# Patient Record
Sex: Male | Born: 1965 | Race: Black or African American | Hispanic: No | Marital: Married | Smoking: Never smoker
Health system: Southern US, Community
[De-identification: ages and names within clinical notes are randomized; demographics above are authoritative.]

## PROBLEM LIST (undated history)

## (undated) ENCOUNTER — Ambulatory Visit (HOSPITAL_COMMUNITY): Admission: EM | Disposition: A | Discharge status: Home / Self Care | Payer: BC Managed Care – PPO

## (undated) DIAGNOSIS — T7840XA Allergy, unspecified, initial encounter: Secondary | ICD-10-CM

## (undated) DIAGNOSIS — R51 Headache: Secondary | ICD-10-CM

## (undated) DIAGNOSIS — I10 Essential (primary) hypertension: Secondary | ICD-10-CM

## (undated) DIAGNOSIS — R519 Headache, unspecified: Secondary | ICD-10-CM

## (undated) DIAGNOSIS — R079 Chest pain, unspecified: Secondary | ICD-10-CM

## (undated) DIAGNOSIS — E785 Hyperlipidemia, unspecified: Secondary | ICD-10-CM

## (undated) HISTORY — PX: KNEE ARTHROSCOPY: SHX127

## (undated) HISTORY — DX: Allergy, unspecified, initial encounter: T78.40XA

## (undated) HISTORY — DX: Chest pain, unspecified: R07.9

## (undated) HISTORY — DX: Headache: R51

## (undated) HISTORY — DX: Essential (primary) hypertension: I10

## (undated) HISTORY — DX: Hyperlipidemia, unspecified: E78.5

## (undated) HISTORY — DX: Headache, unspecified: R51.9

---

## 2000-03-15 ENCOUNTER — Emergency Department (HOSPITAL_COMMUNITY): Admission: EM | Admit: 2000-03-15 | Discharge: 2000-03-22 | Payer: Self-pay | Admitting: Internal Medicine

## 2000-03-22 ENCOUNTER — Emergency Department (HOSPITAL_COMMUNITY): Admission: EM | Admit: 2000-03-22 | Discharge: 2000-03-22 | Payer: Self-pay | Admitting: Emergency Medicine

## 2000-03-22 ENCOUNTER — Encounter: Payer: Self-pay | Admitting: Emergency Medicine

## 2000-08-13 ENCOUNTER — Emergency Department (HOSPITAL_COMMUNITY): Admission: EM | Admit: 2000-08-13 | Discharge: 2000-08-13 | Payer: Self-pay | Admitting: Emergency Medicine

## 2003-08-01 ENCOUNTER — Ambulatory Visit: Admission: RE | Admit: 2003-08-01 | Discharge: 2003-08-01 | Payer: Self-pay | Admitting: Family Medicine

## 2005-03-04 ENCOUNTER — Encounter: Admission: RE | Admit: 2005-03-04 | Discharge: 2005-03-04 | Payer: Self-pay | Admitting: Surgery

## 2005-11-24 ENCOUNTER — Ambulatory Visit: Payer: Self-pay | Admitting: Family Medicine

## 2005-11-24 LAB — CONVERTED CEMR LAB
ALT: 17 units/L (ref 0–40)
AST: 18 units/L (ref 0–37)
Albumin: 4.2 g/dL (ref 3.5–5.2)
Alkaline Phosphatase: 46 units/L (ref 39–117)
BUN: 22 mg/dL (ref 6–23)
CO2: 28 meq/L (ref 19–32)
Calcium: 9.3 mg/dL (ref 8.4–10.5)
Chloride: 107 meq/L (ref 96–112)
Chol/HDL Ratio, serum: 6.2
Cholesterol: 184 mg/dL (ref 0–200)
Creatinine, Ser: 1.1 mg/dL (ref 0.4–1.5)
GFR calc non Af Amer: 80 mL/min
Glomerular Filtration Rate, Af Am: 97 mL/min/{1.73_m2}
Glucose, Bld: 106 mg/dL — ABNORMAL HIGH (ref 70–99)
HCT: 46.1 % (ref 39.0–52.0)
HDL: 29.5 mg/dL — ABNORMAL LOW (ref >39.0)
Hemoglobin: 15.4 g/dL (ref 13.0–17.0)
LDL DIRECT: 82.2 mg/dL
MCHC: 33.5 g/dL (ref 30.0–36.0)
MCV: 87.3 fL (ref 78.0–100.0)
Platelets: 291 10*3/uL (ref 150–400)
Potassium: 3.8 meq/L (ref 3.5–5.1)
RBC: 5.27 M/uL (ref 4.22–5.81)
RDW: 12.7 % (ref 11.5–14.6)
Sodium: 141 meq/L (ref 135–145)
TSH: 3.55 microintl units/mL (ref 0.35–5.50)
Total Bilirubin: 0.8 mg/dL (ref 0.3–1.2)
Total Protein: 7.5 g/dL (ref 6.0–8.3)
Triglyceride fasting, serum: 227 mg/dL (ref 0–149)
VLDL: 45 mg/dL — ABNORMAL HIGH (ref 0–40)
WBC: 5.2 10*3/uL (ref 4.5–10.5)

## 2005-12-28 ENCOUNTER — Ambulatory Visit: Payer: Self-pay | Admitting: Family Medicine

## 2006-01-25 ENCOUNTER — Ambulatory Visit: Payer: Self-pay | Admitting: Family Medicine

## 2006-01-25 LAB — CONVERTED CEMR LAB
BUN: 17 mg/dL (ref 6–23)
CO2: 29 meq/L (ref 19–32)
Calcium: 8.9 mg/dL (ref 8.4–10.5)
Chloride: 101 meq/L (ref 96–112)
Creatinine, Ser: 1.3 mg/dL (ref 0.4–1.5)
GFR calc non Af Amer: 66 mL/min
Glomerular Filtration Rate, Af Am: 79 mL/min/{1.73_m2}
Glucose, Bld: 97 mg/dL (ref 70–99)
Potassium: 3.3 meq/L — ABNORMAL LOW (ref 3.5–5.1)
Sodium: 137 meq/L (ref 135–145)

## 2006-04-29 ENCOUNTER — Ambulatory Visit: Payer: Self-pay | Admitting: Family Medicine

## 2006-04-29 LAB — CONVERTED CEMR LAB
BUN: 22 mg/dL (ref 6–23)
Basophils Absolute: 0 10*3/uL (ref 0.0–0.1)
Basophils Relative: 0.4 % (ref 0.0–1.0)
CO2: 32 meq/L (ref 19–32)
Calcium: 9.4 mg/dL (ref 8.4–10.5)
Chloride: 106 meq/L (ref 96–112)
Creatinine, Ser: 1.3 mg/dL (ref 0.4–1.5)
Eosinophils Absolute: 0.1 10*3/uL (ref 0.0–0.6)
Eosinophils Relative: 1.6 % (ref 0.0–5.0)
GFR calc Af Amer: 79 mL/min
GFR calc non Af Amer: 66 mL/min
Glucose, Bld: 84 mg/dL (ref 70–99)
HCT: 45.6 % (ref 39.0–52.0)
Hemoglobin: 15.4 g/dL (ref 13.0–17.0)
Lymphocytes Relative: 29.2 % (ref 12.0–46.0)
MCHC: 33.9 g/dL (ref 30.0–36.0)
MCV: 85.7 fL (ref 78.0–100.0)
Magnesium: 2.3 mg/dL (ref 1.5–2.5)
Monocytes Absolute: 0.4 10*3/uL (ref 0.2–0.7)
Monocytes Relative: 6 % (ref 3.0–11.0)
Neutro Abs: 4 10*3/uL (ref 1.4–7.7)
Neutrophils Relative %: 62.8 % (ref 43.0–77.0)
Platelets: 334 10*3/uL (ref 150–400)
Potassium: 3.4 meq/L — ABNORMAL LOW (ref 3.5–5.1)
RBC: 5.32 M/uL (ref 4.22–5.81)
RDW: 12.3 % (ref 11.5–14.6)
Sodium: 143 meq/L (ref 135–145)
TSH: 2.8 microintl units/mL (ref 0.35–5.50)
WBC: 6.4 10*3/uL (ref 4.5–10.5)

## 2006-05-12 ENCOUNTER — Ambulatory Visit: Payer: Self-pay | Admitting: Family Medicine

## 2006-05-12 LAB — CONVERTED CEMR LAB: Potassium: 3.6 meq/L (ref 3.5–5.1)

## 2006-05-13 ENCOUNTER — Ambulatory Visit: Payer: Self-pay | Admitting: Family Medicine

## 2006-05-14 ENCOUNTER — Ambulatory Visit: Payer: Self-pay | Admitting: Cardiology

## 2006-05-28 ENCOUNTER — Ambulatory Visit: Payer: Self-pay | Admitting: Family Medicine

## 2006-06-01 ENCOUNTER — Ambulatory Visit: Payer: Self-pay | Admitting: Cardiology

## 2006-06-01 ENCOUNTER — Encounter: Payer: Self-pay | Admitting: Cardiology

## 2006-06-01 ENCOUNTER — Ambulatory Visit: Payer: Self-pay

## 2006-06-16 ENCOUNTER — Ambulatory Visit: Payer: Self-pay | Admitting: Cardiology

## 2006-06-16 ENCOUNTER — Encounter (INDEPENDENT_AMBULATORY_CARE_PROVIDER_SITE_OTHER): Payer: Self-pay | Admitting: Family Medicine

## 2006-06-28 ENCOUNTER — Ambulatory Visit: Payer: Self-pay | Admitting: Cardiology

## 2006-08-30 ENCOUNTER — Ambulatory Visit: Payer: Self-pay | Admitting: Family Medicine

## 2006-08-30 DIAGNOSIS — I10 Essential (primary) hypertension: Secondary | ICD-10-CM | POA: Insufficient documentation

## 2006-08-31 LAB — CONVERTED CEMR LAB
BUN: 18 mg/dL (ref 6–23)
CO2: 30 meq/L (ref 19–32)
Calcium: 8.3 mg/dL — ABNORMAL LOW (ref 8.4–10.5)
Chloride: 104 meq/L (ref 96–112)
Cholesterol: 168 mg/dL (ref 0–200)
Creatinine, Ser: 1.2 mg/dL (ref 0.4–1.5)
GFR calc Af Amer: 87 mL/min
GFR calc non Af Amer: 72 mL/min
Glucose, Bld: 99 mg/dL (ref 70–99)
HDL: 29.8 mg/dL — ABNORMAL LOW (ref >39.0)
LDL Cholesterol: 107 mg/dL — ABNORMAL HIGH (ref 0–99)
Potassium: 3.8 meq/L (ref 3.5–5.1)
Sodium: 140 meq/L (ref 135–145)
Total CHOL/HDL Ratio: 5.6
Triglycerides: 157 mg/dL — ABNORMAL HIGH (ref 0–149)
VLDL: 31 mg/dL (ref 0–40)

## 2006-09-01 ENCOUNTER — Encounter (INDEPENDENT_AMBULATORY_CARE_PROVIDER_SITE_OTHER): Payer: Self-pay | Admitting: *Deleted

## 2006-09-01 LAB — CONVERTED CEMR LAB: Anti Nuclear Antibody(ANA): NEGATIVE

## 2006-10-06 ENCOUNTER — Encounter: Payer: Self-pay | Admitting: Family Medicine

## 2006-10-15 ENCOUNTER — Ambulatory Visit: Payer: Self-pay | Admitting: Family Medicine

## 2006-10-15 DIAGNOSIS — G43009 Migraine without aura, not intractable, without status migrainosus: Secondary | ICD-10-CM | POA: Insufficient documentation

## 2006-11-09 ENCOUNTER — Ambulatory Visit: Payer: Self-pay | Admitting: Family Medicine

## 2006-11-10 ENCOUNTER — Telehealth (INDEPENDENT_AMBULATORY_CARE_PROVIDER_SITE_OTHER): Payer: Self-pay | Admitting: *Deleted

## 2006-11-29 ENCOUNTER — Ambulatory Visit: Payer: Self-pay | Admitting: Family Medicine

## 2007-09-01 ENCOUNTER — Telehealth (INDEPENDENT_AMBULATORY_CARE_PROVIDER_SITE_OTHER): Payer: Self-pay | Admitting: *Deleted

## 2007-09-22 ENCOUNTER — Encounter: Payer: Self-pay | Admitting: Internal Medicine

## 2007-10-04 ENCOUNTER — Telehealth (INDEPENDENT_AMBULATORY_CARE_PROVIDER_SITE_OTHER): Payer: Self-pay | Admitting: *Deleted

## 2007-10-25 ENCOUNTER — Ambulatory Visit: Payer: Self-pay | Admitting: Internal Medicine

## 2007-10-25 DIAGNOSIS — E785 Hyperlipidemia, unspecified: Secondary | ICD-10-CM | POA: Insufficient documentation

## 2007-10-31 ENCOUNTER — Encounter (INDEPENDENT_AMBULATORY_CARE_PROVIDER_SITE_OTHER): Payer: Self-pay | Admitting: *Deleted

## 2007-10-31 LAB — CONVERTED CEMR LAB
BUN: 16 mg/dL (ref 6–23)
CO2: 33 meq/L — ABNORMAL HIGH (ref 19–32)
Calcium: 9.4 mg/dL (ref 8.4–10.5)
Chloride: 101 meq/L (ref 96–112)
Creatinine, Ser: 1.1 mg/dL (ref 0.4–1.5)
GFR calc Af Amer: 96 mL/min
GFR calc non Af Amer: 79 mL/min
Glucose, Bld: 87 mg/dL (ref 70–99)
Potassium: 3.7 meq/L (ref 3.5–5.1)
Sodium: 142 meq/L (ref 135–145)

## 2007-11-14 ENCOUNTER — Telehealth (INDEPENDENT_AMBULATORY_CARE_PROVIDER_SITE_OTHER): Payer: Self-pay | Admitting: *Deleted

## 2007-12-26 ENCOUNTER — Telehealth (INDEPENDENT_AMBULATORY_CARE_PROVIDER_SITE_OTHER): Payer: Self-pay | Admitting: *Deleted

## 2008-06-19 ENCOUNTER — Encounter (INDEPENDENT_AMBULATORY_CARE_PROVIDER_SITE_OTHER): Payer: Self-pay | Admitting: *Deleted

## 2008-06-21 ENCOUNTER — Telehealth (INDEPENDENT_AMBULATORY_CARE_PROVIDER_SITE_OTHER): Payer: Self-pay | Admitting: *Deleted

## 2008-07-04 ENCOUNTER — Ambulatory Visit: Payer: Self-pay | Admitting: Internal Medicine

## 2008-07-04 ENCOUNTER — Encounter: Payer: Self-pay | Admitting: Emergency Medicine

## 2008-07-05 ENCOUNTER — Ambulatory Visit: Payer: Self-pay | Admitting: Cardiology

## 2008-07-05 ENCOUNTER — Inpatient Hospital Stay (HOSPITAL_COMMUNITY): Admission: EM | Admit: 2008-07-05 | Discharge: 2008-07-05 | Payer: Self-pay | Admitting: Internal Medicine

## 2008-07-12 ENCOUNTER — Telehealth (INDEPENDENT_AMBULATORY_CARE_PROVIDER_SITE_OTHER): Payer: Self-pay | Admitting: *Deleted

## 2008-07-16 ENCOUNTER — Ambulatory Visit: Payer: Self-pay | Admitting: Internal Medicine

## 2008-07-16 ENCOUNTER — Encounter: Payer: Self-pay | Admitting: Cardiovascular Disease

## 2008-07-16 ENCOUNTER — Ambulatory Visit: Payer: Self-pay

## 2008-07-17 ENCOUNTER — Ambulatory Visit: Payer: Self-pay | Admitting: Internal Medicine

## 2008-07-17 LAB — CONVERTED CEMR LAB
ALT: 30 units/L (ref 0–53)
AST: 33 units/L (ref 0–37)
Albumin: 4 g/dL (ref 3.5–5.2)
Alkaline Phosphatase: 47 units/L (ref 39–117)
BUN: 17 mg/dL (ref 6–23)
Bilirubin, Direct: 0.2 mg/dL (ref 0.0–0.3)
CO2: 29 meq/L (ref 19–32)
Calcium: 8.7 mg/dL (ref 8.4–10.5)
Chloride: 103 meq/L (ref 96–112)
Cholesterol: 152 mg/dL (ref 0–200)
Creatinine, Ser: 1.1 mg/dL (ref 0.4–1.5)
Direct LDL: 46.9 mg/dL
GFR calc non Af Amer: 95.08 mL/min (ref >60)
Glucose, Bld: 86 mg/dL (ref 70–99)
HDL: 30.9 mg/dL — ABNORMAL LOW (ref >39.00)
Potassium: 3 meq/L — ABNORMAL LOW (ref 3.5–5.1)
Sodium: 142 meq/L (ref 135–145)
Total Bilirubin: 0.9 mg/dL (ref 0.3–1.2)
Total CHOL/HDL Ratio: 5
Total Protein: 7.2 g/dL (ref 6.0–8.3)
Triglycerides: 404 mg/dL — ABNORMAL HIGH (ref 0.0–149.0)
VLDL: 80.8 mg/dL — ABNORMAL HIGH (ref 0.0–40.0)

## 2008-07-20 ENCOUNTER — Ambulatory Visit: Payer: Self-pay | Admitting: Internal Medicine

## 2008-11-23 ENCOUNTER — Ambulatory Visit: Payer: Self-pay | Admitting: Internal Medicine

## 2008-12-03 ENCOUNTER — Encounter (INDEPENDENT_AMBULATORY_CARE_PROVIDER_SITE_OTHER): Payer: Self-pay | Admitting: *Deleted

## 2008-12-03 LAB — CONVERTED CEMR LAB
BUN: 19 mg/dL (ref 6–23)
Basophils Absolute: 0 10*3/uL (ref 0.0–0.1)
Basophils Relative: 0.3 % (ref 0.0–3.0)
CO2: 28 meq/L (ref 19–32)
Calcium: 9.2 mg/dL (ref 8.4–10.5)
Chloride: 103 meq/L (ref 96–112)
Cholesterol: 168 mg/dL (ref 0–200)
Creatinine, Ser: 1.1 mg/dL (ref 0.4–1.5)
Direct LDL: 68.6 mg/dL
Eosinophils Absolute: 0 10*3/uL (ref 0.0–0.7)
Eosinophils Relative: 0.7 % (ref 0.0–5.0)
GFR calc non Af Amer: 94.92 mL/min (ref >60)
Glucose, Bld: 99 mg/dL (ref 70–99)
HCT: 47.1 % (ref 39.0–52.0)
HDL: 38.4 mg/dL — ABNORMAL LOW (ref >39.00)
Hemoglobin: 15.9 g/dL (ref 13.0–17.0)
Lymphocytes Relative: 31.8 % (ref 12.0–46.0)
Lymphs Abs: 2 10*3/uL (ref 0.7–4.0)
MCHC: 33.8 g/dL (ref 30.0–36.0)
MCV: 88.8 fL (ref 78.0–100.0)
Monocytes Absolute: 0.4 10*3/uL (ref 0.1–1.0)
Monocytes Relative: 6.8 % (ref 3.0–12.0)
Neutro Abs: 3.9 10*3/uL (ref 1.4–7.7)
Neutrophils Relative %: 60.4 % (ref 43.0–77.0)
Platelets: 300 10*3/uL (ref 150.0–400.0)
Potassium: 3.8 meq/L (ref 3.5–5.1)
RBC: 5.3 M/uL (ref 4.22–5.81)
RDW: 12.6 % (ref 11.5–14.6)
Sodium: 141 meq/L (ref 135–145)
TSH: 2.7 microintl units/mL (ref 0.35–5.50)
Total CHOL/HDL Ratio: 4
Triglycerides: 233 mg/dL — ABNORMAL HIGH (ref 0.0–149.0)
VLDL: 46.6 mg/dL — ABNORMAL HIGH (ref 0.0–40.0)
WBC: 6.3 10*3/uL (ref 4.5–10.5)

## 2008-12-04 ENCOUNTER — Encounter: Payer: Self-pay | Admitting: Internal Medicine

## 2009-01-08 ENCOUNTER — Ambulatory Visit: Payer: Self-pay | Admitting: Internal Medicine

## 2009-01-31 ENCOUNTER — Encounter: Admission: RE | Admit: 2009-01-31 | Discharge: 2009-05-01 | Payer: Self-pay | Admitting: Orthopedic Surgery

## 2009-05-01 ENCOUNTER — Ambulatory Visit: Payer: Self-pay | Admitting: Internal Medicine

## 2009-11-12 ENCOUNTER — Ambulatory Visit: Payer: Self-pay | Admitting: Internal Medicine

## 2009-11-18 LAB — CONVERTED CEMR LAB
BUN: 17 mg/dL (ref 6–23)
CO2: 29 meq/L (ref 19–32)
Calcium: 9.6 mg/dL (ref 8.4–10.5)
Chloride: 104 meq/L (ref 96–112)
Creatinine, Ser: 1.1 mg/dL (ref 0.4–1.5)
GFR calc non Af Amer: 97.53 mL/min (ref >60)
Glucose, Bld: 76 mg/dL (ref 70–99)
Potassium: 4.1 meq/L (ref 3.5–5.1)
Sodium: 139 meq/L (ref 135–145)

## 2010-02-20 NOTE — Letter (Signed)
Summary: [facility]  [facility]   Imported By: Lanelle Bal 12/11/2008 13:44:42  _____________________________________________________________________  External Attachment:    Type:   Image     Comment:   External Document

## 2010-02-20 NOTE — Assessment & Plan Note (Signed)
Summary: follow up   ph   Vital Signs:  Patient Profile:   45 Years Old Male Weight:      208.50 pounds Temp:     98.3 degrees F oral Pulse rate:   68 / minute Resp:     16 per minute BP sitting:   120 / 90  (right arm)  Pt. in pain?   no  Vitals Entered By: Ardyth Man (November 29, 2006 1:09 PM)                  Chief Complaint:  ANKLE FOLLOW UP.  History of Present Illness: patient reports that his Achilles' tendon discomfort is resolved, but now having some discomfort at the bottom of the left heel. Typically the discomfort present after waking up first in the morning or after prolonged sitting.  He states that the pain is not severe and it does not hinder him from doing his daily activity.  Current Allergies: No known allergies       Physical Exam  Extremities:     semination the left foot is significant for tenderness over the calcaneal tuberosity.  No obvious deformity noted.    Impression & Recommendations:  Problem # 1:  PLANTAR FASCIITIS (ICD-728.71)   Discussed use of gel inserts, ice massage, and stretching exercises.  patient information was provided.  Advise patient if no improvement over 8-12  weeks he should follow up or if worsens. His updated medication list for this problem includes:    Ketoprofen 75 Mg Caps (Ketoprofen) .Marland Kitchen... Take as directed   Complete Medication List: 1)  Hydrochlorothiazide 25 Mg Tabs (Hydrochlorothiazide) .Marland Kitchen.. 1 by mouth qd 2)  Klor-con M20 20 Meq Tbcr (Potassium chloride crys cr) .Marland Kitchen.. 1 by mouth qd 3)  Aspirin 81 Mg Tbec (Aspirin) 4)  Fish Oil  5)  Glucosamine  6)  Multi-vitamin  7)  Relpax 40 Mg Tabs (Eletriptan hydrobromide) .... Take one tablet at onset of headache and repeat in two hours if needed. 8)  Ketoprofen 75 Mg Caps (Ketoprofen) .... Take as directed     ]

## 2010-02-20 NOTE — Letter (Signed)
Summary: Primary Care Appointment Letter  [facility] [place]  [address] [place], [place] [address]   Phone: [phone]  Fax: [phone]    06/19/2008 MRN: [id number]  [facility] [address] [place], [place]  [address]  Dear Mr. Jimmey Ralph,   Your Primary Care Physician [place] E. Paz MD has indicated that:    ___X____it is time to schedule an appointment for Physical and Fasting labs before next refill.     _______you missed your appointment on______ and need to call and          reschedule.    _______you need to have lab work done.    _______you need to schedule an appointment discuss lab or test results.    _______you need to call to reschedule your appointment that is                       scheduled on _________.     Please call our office as soon as possible. Our phone number is [phone].     Thank you,    [facility] Sched[facility]

## 2010-02-20 NOTE — Assessment & Plan Note (Signed)
Summary: FOLLOWUP FOR HEART MEDICATION///SPH   Vital Signs:  Patient profile:   45 year old male Weight:      212 pounds Pulse rate:   75 / minute Pulse rhythm:   regular BP sitting:   144 / 86  (left arm) Cuff size:   large  Vitals Entered By: Army Fossa CMA (November 12, 2009 2:08 PM) CC: Pt here to f/u on BP meds- not fasitng  Comments [facility] [address]    History of Present Illness: ROV  Feeling well Occasionally feels some pressure in the right hip with certain movements, no injury. No inguinal hernias that he can tell  Current Medications (verified): 1)  Maxzide-25 37.5-25 Mg Tabs (Triamterene-Hctz) .... One P.o. Daily. Must Make Appt For Additional Refills. 2)  Relpax 40 Mg  Tabs (Eletriptan Hydrobromide) .... Take One Tablet At Onset of Headache and Repeat in Two Hours If Needed. 3)  Ketoprofen 75 Mg  Caps (Ketoprofen) .... Take As Directed 4)  Aspirin 81 Mg  Tbec (Aspirin) .... Take One Tablet By Mouth Once Daily. 5)  Fish Oil   Oil (Fish Oil) .... 5 Tablets Daily 6)  Multivitamins   Tabs (Multiple Vitamin) .... Take One Tablet By Mouth Once Daily.  Allergies (verified): No Known Drug Allergies  Past History:  Past Medical History: Reviewed history from 07/17/2008 and no changes required. Hypertension COMMON MIGRAINE  (triggers intense light, pork) Hyperlipidemia Chest pain   --exercise ech 2008 normal   --exercise Myoview June 2010: normal  Past Surgical History: Reviewed history from 10/25/2007 and no changes required. left knee scope x 2  Family History: Reviewed history from 10/25/2007 and no changes required. CAD - F, PGF DM - PGM stroke - PGF HTN - F colon Ca - no prostate Ca - no M deceased - lupus  Social History: Married no children Occupation: work in an office Never Smoked Alcohol use-yes (occasionally) Drug use-no Regular exercise- some  diet-- regular  caffeine use - 3-4 sodas once daily  Review of Systems   ambulatory BP is within normal, "usually lower than today" Good medication compliance History of migraines, getting headaches very rarely Denies chest pain or shortness of breath    Physical Exam  General:  alert and well-developed.   Lungs:  normal respiratory effort, no intercostal retractions, no accessory muscle use, and normal breath sounds.   Heart:  normal rate, regular rhythm, and no murmur.   Abdomen:  soft, non-tender, and no inguinal hernia.   Extremities:  normal rotation of the hips bilaterally, no pain   Impression & Recommendations:  Problem # 1:  HYPERLIPIDEMIA (ICD-272.4) on diet only Labs Reviewed: SGOT: 33 (07/16/2008)   SGPT: 30 (07/16/2008)   HDL:38.40 (11/23/2008), 30.90 (07/16/2008)  LDL:107 (08/30/2006), DEL (11/24/2005)  Chol:168 (11/23/2008), 152 (07/16/2008)  Trig:233.0 (11/23/2008), 404.0 (07/16/2008)  Problem # 2:  HYPERTENSION (ICD-401.9) well-controlled His updated medication list for this problem includes:    Maxzide-25 37.5-25 Mg Tabs (Triamterene-hctz) ..... One p.o. daily.  Orders: Venipuncture ([address]) TLB-BMP (Basic Metabolic Panel-BMET) (80048-METABOL) Specimen Handling ([address])  BP today: 144/86 Prior BP: 142/80 (05/01/2009)  Labs Reviewed: K+: 3.8 (11/23/2008) Creat: : 1.1 (11/23/2008)   Chol: 168 (11/23/2008)   HDL: 38.40 (11/23/2008)   LDL: 107 (08/30/2006)   TG: 233.0 (11/23/2008)  Problem # 3:  COMMON MIGRAINE (ICD-346.10) refill medicines His updated medication list for this problem includes:    Relpax 40 Mg Tabs (Eletriptan hydrobromide) .Marland Kitchen... Take one tablet at onset of headache and repeat in  two hours if needed.    Ketoprofen 75 Mg Caps (Ketoprofen) .Marland Kitchen... Take as directed    Aspirin 81 Mg Tbec (Aspirin) .Marland Kitchen... Take one tablet by mouth once daily.  Problem # 4:  right hip pain observation for now, will call if worse  Complete Medication List: 1)  Maxzide-25 37.5-25 Mg Tabs (Triamterene-hctz) .... One p.o. daily. 2)   Relpax 40 Mg Tabs (Eletriptan hydrobromide) .... Take one tablet at onset of headache and repeat in two hours if needed. 3)  Ketoprofen 75 Mg Caps (Ketoprofen) .... Take as directed 4)  Aspirin 81 Mg Tbec (Aspirin) .... Take one tablet by mouth once daily. 5)  Fish Oil Oil (Fish oil) .... 5 tablets daily 6)  Multivitamins Tabs (Multiple vitamin) .... Take one tablet by mouth once daily.  Patient Instructions: 1)  Please schedule a follow-up appointment in 4 to 5  months, fasting, physical exam Prescriptions: KETOPROFEN 75 MG  CAPS (KETOPROFEN) Take as directed  #60 x 0   Entered and Authorized by:   Nolon Rod. Paz MD   Signed by:   Nolon Rod. Paz MD on 11/12/2009   Method used:   Electronically to        [facility] [phone]* (retail)       [address]       [place], [place]  [address]       Ph: [phone]       Fax: [phone]   RxID:   [id number] RELPAX 40 MG  TABS (ELETRIPTAN HYDROBROMIDE) Take one tablet at onset of headache and repeat in two hours if needed.  #8 x 2   Entered and Authorized by:   Nolon Rod. Paz MD   Signed by:   Nolon Rod. Paz MD on 11/12/2009   Method used:   Electronically to        [facility] [phone]* (retail)       [address]       [place], [place]  [address]       Ph: [phone]       Fax: [phone]   RxID:   [id number] MAXZIDE-25 37.5-25 MG TABS (TRIAMTERENE-HCTZ) one p.o. daily.  #90 x 2   Entered and Authorized by:   Nolon Rod. Paz MD   Signed by:   Nolon Rod. Paz MD on 11/12/2009   Method used:   Electronically to        [facility] [phone]* (retail)       [address]       [place], [place]  [address]       Ph: [phone]       Fax: [phone]   RxID:   [id number] MAXZIDE-25 37.5-25 MG TABS (TRIAMTERENE-HCTZ) one p.o. daily. MUST MAKE APPT FOR ADDITIONAL REFILLS.  #30 x 5   Entered by:   Army Fossa CMA   Authorized by:   Nolon Rod. Paz MD   Signed by:   Army Fossa CMA on 11/12/2009   Method used:   Electronically to        [facility] [phone]* (retail)       [address]       [place], [place]  [address]       Ph: [phone]  Fax: [phone]   RxID:   [id number]    Orders Added: 1)  Venipuncture [30865] 2)  TLB-BMP (Basic Metabolic Panel-BMET) [80048-METABOL] 3)  Specimen Handling [99000] 4)  Est. Patient Level III [78469]   Immunization History:  Influenza Immunization History:    Influ[facility]:  [facility]  (11/06/2009)   Immunization History:  Influenza Immunization History:    Influenza:  got @ [facility]  (11/06/2009)

## 2010-02-20 NOTE — Assessment & Plan Note (Signed)
    History of Present Illness: Reports he feels back to baseline. Returned from his vacation last week. No recurrenceof symptoms. Reviewed with him the negative lyme and RMSF titers.  Patietn also states has his father fax mother death certificate to see if there is anything he need to worry about since she had lupus and died suddently.          Impression & Recommendations:  Problem # 1:  VIRAL INFECTION (ICD-079.99) 1.no new treatment 2.Reviewed mother's death certificate and she died of septic shock secondary to a pneumonia.   Patient Instructions: 1)  Please schedule a follow-up appointment in 3 months. for bp check. At that point can check ANA if he would like although patient asymptomatic.

## 2010-02-20 NOTE — Assessment & Plan Note (Signed)
Summary: RIGHT ANKLE PAIN/ALJ   Vital Signs:  Patient Profile:   45 Years Old Male Weight:      203.50 pounds Temp:     98.1 degrees F oral Pulse rate:   62 / minute Resp:     14 per minute BP sitting:   110 / 80  (right arm)  Pt. in pain?   yes    Location:   ankle    Intensity:   6    Type:       sharp  Vitals Entered By: Ardyth Man (November 09, 2006 11:10 AM)                  Chief Complaint:  Right ankle hurting since last Monday-Tuesday.  History of Present Illness: Marcus Santiago reports that 1 week ago started feeling discomfort in left heel/achilles tendon. Started after playing basketball which he plays twice per week. He has iced the area and has not played basketball this past week. Reports it feels better. No gait disturbance. No weakness  Current Allergies: No known allergies       Physical Exam  Msk:     Examination of left heel/ankle is significant for no edema/swelling. Full ROM. Minimal tenderness inferior portion of achilles. Gait normal. Pulses:     dorsalis pedis and posterior tibial pulses are full and equal bilaterally    Impression & Recommendations:  Problem # 1:  ACHILLES TENDINITIS, MILD (ICD-726.71) Patient information provided Recommended avoiding precipitating activity until pain free Exercise instructions provided NSAID instructions, potential side effects reviewed Advise not take more than 10days and monitor blood pressure F/u if no improvement or worsens  Complete Medication List: 1)  Hydrochlorothiazide 25 Mg Tabs (Hydrochlorothiazide) .Marland Kitchen.. 1 by mouth qd 2)  Klor-con M20 20 Meq Tbcr (Potassium chloride crys cr) .Marland Kitchen.. 1 by mouth qd 3)  Aspirin 81 Mg Tbec (Aspirin) 4)  Fish Oil  5)  Glucosamine  6)  Multi-vitamin  7)  Relpax 40 Mg Tabs (Eletriptan hydrobromide) .... Take one tablet at onset of headache and repeat in two hours if needed. 8)  Ketoprofen 75 Mg Caps (Ketoprofen) .... Take as directed     ]

## 2010-02-20 NOTE — Letter (Signed)
Summary: Handout Printed  Printed Handout:  - *[facility] Patient Instructions 

## 2010-02-20 NOTE — Assessment & Plan Note (Signed)
Summary: eph/jml  Medications Added ASPIRIN 81 MG  TBEC (ASPIRIN) Take one tablet by mouth once daily. FISH OIL   OIL (FISH OIL) 5 tablets daily MULTIVITAMINS   TABS (MULTIPLE VITAMIN) Take one tablet by mouth once daily.      Allergies Added: NKDA  Visit Type:  Follow-up Primary Simren Popson:  Nolon Rod. Paz MD   History of Present Illness: 45 y/o male with h/o HTN and CP with normal exercise echo in 2008. recently admitted overnight with atypcial CP. ECG and cardiac enzymes normal. Had f/u muoview which showed good exercise tolerance and no ischemia or infarct. Normal EF.  After being d/c'd from hospital had several days of diarrhea and still says he doesn't feel quite right with early satiety. Occasional abdominal/chest discomfort which gets some better with Mylanta. Exercise tolearnace good though he doesn't workout as much as he wants.   Current Medications (verified): 1)  Hydrochlorothiazide 25 Mg  Tabs (Hydrochlorothiazide) .Marland Kitchen.. 1 By Mouth Qd 2)  Relpax 40 Mg  Tabs (Eletriptan Hydrobromide) .... Take One Tablet At Onset of Headache and Repeat in Two Hours If Needed. 3)  Ketoprofen 75 Mg  Caps (Ketoprofen) .... Take As Directed 4)  Aspirin 81 Mg  Tbec (Aspirin) .... Take One Tablet By Mouth Once Daily. 5)  Fish Oil   Oil (Fish Oil) .... 5 Tablets Daily 6)  Multivitamins   Tabs (Multiple Vitamin) .... Take One Tablet By Mouth Once Daily.  Allergies (verified): No Known Drug Allergies  Past History:  Past Medical History: Hypertension COMMON MIGRAINE  (triggers intense light, pork) Hyperlipidemia Chest pain   --exercise ech 2008 normal   --exercise Myoview June 2010: normal  Review of Systems       As per HPI and past medical history; otherwise all systems negative.   Vital Signs:  Patient profile:   45 year old male Height:      70 inches Weight:      217 pounds BMI:     31.25 Pulse rate:   108 / minute BP sitting:   130 / 72  (left arm)  Vitals Entered By: Laurance Flatten CMA (July 17, 2008 2:50 PM)  Physical Exam  General:  Gen: well appearing. no resp difficulty HEENT: normal Neck: supple. no JVD. Carotids 2+ bilat; not bruits. No lymphadenopathy or thryomegaly appreciated. Cor: PMI nondisplaced. Regular rate & rhythm. No rubs, gallops, murmur. Lungs: clear Abdomen: soft, nontender, nondistended. No hepatosplenomegaly. No bruits or masses. Good bowel sounds. Extremities: no cyanosis, clubbing, rash, edema Neuro: alert & orientedx3, cranial nerves grossly intact. moves all 4 extremities w/o difficulty. affect pleasant    Impression & Recommendations:  Problem # 1:  CHEST DISCOMFORT, ATYPICAL (ICD-786.59) Myoview reassuring. Suspect it may be GI in nature. Recommneded trial of over-the-counter PPI.  Problem # 2:  HYPERTENSION (ICD-401.9) BP fairly well contrlled though he is having hypokalemia on HCTZ. Consider changing to another agent, possibly spironlactone or norvasc.  Problem # 3:  TACHYCARDIA (ICD-785) HRs in the hospital were in 60-70 range. If tachycardia persists would check labs and echo.  Has f/u with Dr. Drue Novel in 1-2 weeks.

## 2010-02-20 NOTE — Letter (Signed)
Summary: Handout Printed  Printed Handout:  - Achilles Tendon Injury

## 2010-02-20 NOTE — Letter (Signed)
Summary: Results Follow up Letter  [facility] [place]  [address] [place], [place] [address]   Phone: [phone]  Fax: [phone]    10/31/2007 MRN: [id number]  [facility] [address] [place], [place]  [address]  Dear Marcus Santiago,  The following are the results of your recent test(s):  Test         Result    Pap Smear:        Normal _____  Not Normal _____ Comments: ______________________________________________________ Cholesterol: LDL(Bad cholesterol):         Your goal is less than:         HDL (Good cholesterol):       Your goal is more than: Comments:  ______________________________________________________ Mammogram:        Normal _____  Not Normal _____ Comments:  ___________________________________________________________________ Hemoccult:        Normal _____  Not normal _______ Comments:    _____________________________________________________________________ Other Tests: PLEASE SEE ATTACHED LABS FROM 10/25/07 AND COMMENTS    We routinely do not discuss normal results over the telephone.  If you desire a copy of the results, or you have any questions about this information we can discuss them at your next office visit.   Sincerely,

## 2010-02-20 NOTE — Letter (Signed)
Summary: Results Follow up Letter  [facility] [place]  [address] [place], [place] [address]   Phone: [phone]  Fax: [phone]    12/03/2008 MRN: [id number]  [facility] [address] [place], [place]  [address]  Dear Mr. Haberle,  The following are the results of your recent test(s):  Test         Result    Pap Smear:        Normal _____  Not Normal _____ Comments: ______________________________________________________ Cholesterol: LDL(Bad cholesterol):         Your goal is less than:         HDL (Good cholesterol):       Your goal is more than: Comments:  ______________________________________________________ Mammogram:        Normal _____  Not Normal _____ Comments:  ___________________________________________________________________ Hemoccult:        Normal _____  Not normal _______ Comments:    _____________________________________________________________________ Other Tests:  SEE ATTACHED LABS:  - potassium is now normal  - continue same medications  - triglycerides are better  - continue with fish oil, diet, & exercise  - GOOD RESULTS!!!  - follow up as planned Call me if you have any questions. stacia [phone] ext [phone]

## 2010-02-20 NOTE — Progress Notes (Signed)
Summary: hctz rx  Phone Note Refill Request Message from:  Patient  Refills Requested: Medication #1:  HYDROCHLOROTHIAZIDE 25 MG  TABS 1 by mouth qd Initial call taken by: Kandice Hams,  June 21, 2008 8:36 AM      Prescriptions: HYDROCHLOROTHIAZIDE 25 MG  TABS (HYDROCHLOROTHIAZIDE) 1 by mouth qd  #30.0 Each x 0   Entered by:   Kandice Hams   Authorized by:   Nolon Rod. Paz MD   Signed by:   Kandice Hams on 06/21/2008   Method used:   Faxed to ...       [facility] [address] #[id number]* (retail)       [address] [place], [place]  [address]       Ph: [phone]       Fax: [phone]   RxID:   [id number]

## 2010-02-20 NOTE — Assessment & Plan Note (Signed)
Summary: ear are stopped up/kdc   Vital Signs:  Patient profile:   45 year old male Height:      70 inches Weight:      213.6 pounds BMI:     30.76 BP sitting:   142 / 80  Vitals Entered By: Shary Decamp (May 01, 2009 3:20 PM) CC: ears full   Current Medications (verified): 1)  Maxzide-25 37.5-25 Mg Tabs (Triamterene-Hctz) .... One P.o. Daily 2)  Relpax 40 Mg  Tabs (Eletriptan Hydrobromide) .... Take One Tablet At Onset of Headache and Repeat in Two Hours If Needed. 3)  Ketoprofen 75 Mg  Caps (Ketoprofen) .... Take As Directed 4)  Aspirin 81 Mg  Tbec (Aspirin) .... Take One Tablet By Mouth Once Daily. 5)  Fish Oil   Oil (Fish Oil) .... 5 Tablets Daily 6)  Multivitamins   Tabs (Multiple Vitamin) .... Take One Tablet By Mouth Once Daily.  Allergies (verified): No Known Drug Allergies   Impression & Recommendations:  Problem # 1:  CERUMEN IMPACTION (ICD-380.4)  abundant cerumen removed by my nurse post lavage exam showed normal TMs and cannal patient asked to lavage his ears on every CPX  Orders: Cerumen Impaction Removal ([address])  Complete Medication List: 1)  Maxzide-25 37.5-25 Mg Tabs (Triamterene-hctz) .... One p.o. daily 2)  Relpax 40 Mg Tabs (Eletriptan hydrobromide) .... Take one tablet at onset of headache and repeat in two hours if needed. 3)  Ketoprofen 75 Mg Caps (Ketoprofen) .... Take as directed 4)  Aspirin 81 Mg Tbec (Aspirin) .... Take one tablet by mouth once daily. 5)  Fish Oil Oil (Fish oil) .... 5 tablets daily 6)  Multivitamins Tabs (Multiple vitamin) .... Take one tablet by mouth once daily.

## 2010-02-20 NOTE — Assessment & Plan Note (Signed)
Summary: Cardiology Nuclear Study  Nuclear Med Background Indications for Stress Test: Evaluation for Ischemia, Post Hospital  Indications Comments: 07/04/08 Chest pressure;(-)enzymes  History: Echo  History Comments: '08 Stress Echo:negative  Symptoms: Chest Pressure    Nuclear Pre-Procedure Cardiac Risk Factors: Family History - CAD, Hypertension, Lipids, Obesity Caffeine/Decaff Intake: none NPO After: 10:00 PM IV 0.9% NS with Angio Cath: 22g     IV Site: (R) AC IV Started by: Irean Hong RN Chest Size (in) 44     Height (in): 70 Weight (lb): 212 BMI: 30.53  Nuclear Med Study 1 or 2 day study:  1 day     Stress Test Type:  Stress Reading MD:  Marca Ancona, MD     Referring MD:  Arvilla Meres, MD Resting Radionuclide:  Technetium 65m Tetrofosmin     Resting Radionuclide Dose:  10 mCi  Stress Radionuclide:  Technetium 34m Tetrofosmin     Stress Radionuclide Dose:  33.0 mCi   Stress Protocol Exercise Time (min):  10:30 min     Max HR:  181 bpm     Predicted Max HR: 180 bpm  Max Systolic BP: 170 mm Hg     % Max HR:  100 %     METS: 12.6 Rate Pressure Product:  [address]    Stress Test Technologist:  Rea [employer] CMA-N     Nuclear Technologist:  Domenic Polite CNMT  Rest Procedure  Myocardial perfusion imaging was performed at rest 45 minutes following the intraveneous administration of Myoview Technetium 49m Tetrofosmin.  Stress Procedure  The patient exercised for 10:30.  The patient stopped due to fatigue and denied any chest pain.  There were no significant ST-T wave changes.  Myoview was injected at peak exercise and myocardial perfusion imaging was performed after a brief delay.  QPS Raw Data Images:  Normal; no motion artifact; normal heart/lung ratio. Stress Images:  NI: Uniform and normal uptake of tracer in all myocardial segments. Rest Images:  Normal homogeneous uptake in all areas of the myocardium. Subtraction (SDS):  Normal Transient Ischemic  Dilatation:  .80  (Normal <1.22)  Lung/Heart Ratio:  .29  (Normal <0.45)  Quantitative Gated Spect Images QGS EDV:  89 ml QGS ESV:  31 ml QGS EF:  65 % QGS cine images:  Normal  Findings Normal nuclear study      Overall Impression  Exercise Capacity: Good exercise capacity. BP Response: Normal blood pressure response. Clinical Symptoms: Migraine ECG Impression: No significant ST segment change suggestive of ischemia. Overall Impression: Normal stress nuclear study. Overall Impression Comments: Normal  Appended Document: Cardiology Nuclear Study ok will d/w patient at [facility] today

## 2010-02-20 NOTE — Assessment & Plan Note (Signed)
Summary: rto 4 months.cbs  Flu Vaccine Consent Questions     Do you have a history of severe allergic reactions to this vaccine? no    Any prior history of allergic reactions to egg and/or gelatin? no    Do you have a sensitivity to the preservative Thimersol? no    Do you have a past history of Guillan-Barre Syndrome? no    Do you currently have an acute febrile illness? no    Have you ever had a severe reaction to latex? no    Vaccine information given and explained to patient? yes    Are you currently pregnant? no    Lot Number:[id number]   Exp Date:07/18/2009   Site Given: rt Deltoid IM Shary Decamp  November 23, 2008 1:36 PM  Vital Signs:  Patient profile:   45 year old male Weight:      210.50 pounds Pulse rate:   64 / minute BP sitting:   140 / 90  Vitals Entered By: Kandice Hams (November 23, 2008 12:58 PM) CC: 4 month followup   History of Present Illness: here for follow-up. He was switched from HCTZ  to Maxzide due to hypokalemia, he feels fine.  No side effects  Allergies: No Known Drug Allergies  Past History:  Past Medical History: Reviewed history from 07/17/2008 and no changes required. Hypertension COMMON MIGRAINE  (triggers intense light, pork) Hyperlipidemia Chest pain   --exercise ech 2008 normal   --exercise Myoview June 2010: normal  Past Surgical History: Reviewed history from 10/25/2007 and no changes required. left knee scope x 2  Family History: Reviewed history from 10/25/2007 and no changes required. CAD - F, PGF DM - PGM stroke - PGF HTN - F colon Ca - no prostate Ca - no M deceased - lupus  Social History: Reviewed history from 07/20/2008 and no changes required. Married no children Occupation: work in an office Never Smoked Alcohol use-yes (occasionally) Drug use-no Regular exercise-no (works in yard 1-2x/wk) caffeine use - 3-4 sodas qd  Review of Systems       he is exercising more.  restarted fish- oil  supplements. His diet has not changed. amb. BPs  are in the high side of normal although he checks very infrequently no further chest pain  Physical Exam  General:  alert and well-developed.   Lungs:  normal respiratory effort, no intercostal retractions, no accessory muscle use, and normal breath sounds.   Heart:  normal rate, regular rhythm, and no murmur.   Extremities:  no edema   Impression & Recommendations:  Problem # 1:  HYPERLIPIDEMIA (ICD-272.4)  high triglycerides, exercising more and taking fish oil.  Recheck  Labs Reviewed: SGOT: 33 (07/16/2008)   SGPT: 30 (07/16/2008)   HDL:30.90 (07/16/2008), 29.8 (08/30/2006)  LDL:107 (08/30/2006), DEL (11/24/2005)  Chol:152 (07/16/2008), 168 (08/30/2006)  Trig:404.0 (07/16/2008), 157 (08/30/2006)  Orders: TLB-Lipid Panel (80061-LIPID) TLB-TSH (Thyroid Stimulating Hormone) (84443-TSH)  Problem # 2:  HYPERTENSION (ICD-401.9)  now on Maxzide due to hypokalemia check BMP. BP slightly elevated today, see instructions His updated medication list for this problem includes:    Maxzide-25 37.5-25 Mg Tabs (Triamterene-hctz) ..... One p.o. daily  Orders: Venipuncture (04540) TLB-BMP (Basic Metabolic Panel-BMET) (80048-METABOL)  Problem # 3:  HEALTH MAINTENANCE EXAM (ICD-V70.0)  checking a CBC today  Orders: TLB-CBC Platelet - w/Differential (85025-CBCD)  Complete Medication List: 1)  Maxzide-25 37.5-25 Mg Tabs (Triamterene-hctz) .... One p.o. daily 2)  Relpax 40 Mg Tabs (Eletriptan hydrobromide) .... Take one tablet  at onset of headache and repeat in two hours if needed. 3)  Ketoprofen 75 Mg Caps (Ketoprofen) .... Take as directed 4)  Aspirin 81 Mg Tbec (Aspirin) .... Take one tablet by mouth once daily. 5)  Fish Oil Oil (Fish oil) .... 5 tablets daily 6)  Multivitamins Tabs (Multiple vitamin) .... Take one tablet by mouth once daily.  Other Orders: Admin 1st Vaccine (62952) Flu Vaccine 22yrs + (84132)  Patient  Instructions: 1)  Check your blood pressure 1  time  a week. If it is more than 140/85 consistently,please let us know  2)  Please schedule a follow-up appointment in 6 months .

## 2010-02-20 NOTE — Letter (Signed)
Summary: Results Follow up Letter  [facility] [facility] [place]  [address] [place], [place] [address]   Phone: [phone]  Fax: [phone]    09/01/2006 MRN: [id number]  [facility] [address] [place], [place]  [address]  Dear Marcus Santiago,  The following are the results of your recent test(s):  Test         Result    Pap Smear:        Normal _____  Not Normal _____ Comments: ______________________________________________________ Cholesterol: LDL(Bad cholesterol):         Your goal is less than:         HDL (Good cholesterol):       Your goal is more than: Comments:  ______________________________________________________ Mammogram:        Normal _____  Not Normal _____ Comments:  ___________________________________________________________________ Hemoccult:        Normal _____  Not normal _______ Comments:    _____________________________________________________________________ Other Tests: LABS NORMAL   We routinely do not discuss normal results over the telephone.  If you desire a copy of the results, or you have any questions about this information we can discuss them at your next office visit.   Sincerely,

## 2010-02-20 NOTE — Progress Notes (Signed)
Summary: Nuc pre procedure   Phone Note Outgoing Call Call back at [facility] Phone [phone]   Call placed by: Rea College, CMA,  July 12, 2008 4:42 PM Call placed to: Patient Summary of Call: Left message with information on Myoview Information Sheet (see scanned document for details).        Nuclear Med Background Indications for Stress Test: Evaluation for Ischemia, Post Hospital  Indications Comments: 07/04/08 Chest pressure;(-)enzymes  History: Echo  History Comments: '08 Stress Echo:negative  Symptoms: Chest Pressure    Nuclear Pre-Procedure Cardiac Risk Factors: Family History - CAD, Hypertension, Lipids

## 2010-02-20 NOTE — Letter (Signed)
Summary: [facility]  [facility]   Imported By: Vanessa Swaziland 10/12/2006 15:33:47  _____________________________________________________________________  External Attachment:    Type:   Image     Comment:   External Document

## 2010-02-20 NOTE — Assessment & Plan Note (Signed)
Summary: to go over labs//tl   Vital Signs:  Patient Profile:   45 Years Old Male Weight:      207.38 pounds Temp:     98.5 degrees F oral Pulse rate:   68 / minute Resp:     14 per minute BP sitting:   120 / 84  (right arm)  Pt. in pain?   no  Vitals Entered By: Ardyth Man (October 15, 2006 10:03 AM)                  Chief Complaint:  Discuss labs.  History of Present Illness: Zakery would like me to take over his migraine treatment.  He reports that he takes Relpax and ketoprofen very infrequently for migraine.  [facility].  Forcefully, his insurance company does not cover their office any longer.  Patient reports he's been stable.  He is not been seen since 2007 at their office.  He also likely to review his lab results i.e. his lipid profile and ANA.  They were both overall remarkable.  He does have a low HDL.  Advise patient to continue regular physical activity.  Current Allergies: No known allergies         Impression & Recommendations:  Problem # 1:  HYPERTENSION (ICD-401.9)  His updated medication list for this problem includes:    Hydrochlorothiazide 25 Mg Tabs (Hydrochlorothiazide) .Marland Kitchen... 1 by mouth qd  BP today: 120/84 Prior BP: 125/94 (08/30/2006)  Labs Reviewed: Creat: 1.2 (08/30/2006) Chol: 168 (08/30/2006)   HDL: 29.8 (08/30/2006)   LDL: 107 (08/30/2006)   TG: 157 (08/30/2006)   Problem # 2:  COMMON MIGRAINE (ICD-346.10)  His updated medication list for this problem includes:       Relpax 40 Mg Tabs (Eletriptan hydrobromide) .Marland Kitchen... Take one tablet at onset of headache and repeat in two hours if needed.    Ketoprofen 75 Mg Caps (Ketoprofen) .Marland Kitchen... Take as directed  His updated medication list for this problem includes:    Aspirin 81 Mg Tbec (Aspirin)    Relpax 40 Mg Tabs (Eletriptan hydrobromide) .Marland Kitchen... Take one tablet at onset of headache and repeat in two hours if needed.  Ketoprofen 75 Mg Caps (Ketoprofen) .Marland Kitchen... Take as directed   Complete Medication List: 1)  Hydrochlorothiazide 25 Mg Tabs (Hydrochlorothiazide) .Marland Kitchen.. 1 by mouth qd 2)  Klor-con M20 20 Meq Tbcr (Potassium chloride crys cr) .Marland Kitchen.. 1 by mouth qd 3)  Aspirin 81 Mg Tbec (Aspirin) 4)  Fish Oil  5)  Glucosamine  6)  Multi-vitamin  7)  Relpax 40 Mg Tabs (Eletriptan hydrobromide) .... Take one tablet at onset of headache and repeat in two hours if needed. 8)  Ketoprofen 75 Mg Caps (Ketoprofen) .... Take as directed     Prescriptions: KETOPROFEN 75 MG  CAPS (KETOPROFEN) Take as directed  #60 x 3   Entered by:   Ardyth Man   Authorized by:   Leanne Chang MD   Signed by:   Ardyth Man on 10/15/2006   Method used:   Electronically sent to ...       [facility]       [address]       [place], [place]  [address]       Ph: [phone]       Fax: [phone]   RxID:   [id number] RELPAX 40 MG  TABS (ELETRIPTAN HYDROBROMIDE) Take one tablet at onset of headache and repeat  in two hours if needed.  #8 x 2   Entered by:   Ardyth Man   Authorized by:   Leanne Chang MD   Signed by:   Ardyth Man on 10/15/2006   Method used:   Electronically sent to ...       [facility]       [address]       [place], [place]  [address]       Ph: [phone]       Fax: [phone]   RxID:   [id number]  ]

## 2010-02-20 NOTE — Letter (Signed)
Summary: Handout Printed  Printed Handout:  - Plantar Fasciitis 

## 2010-02-20 NOTE — Assessment & Plan Note (Signed)
Summary: ROA,3 MONTHS,CBS   Vital Signs:  Patient Profile:   45 Years Old Male Weight:      212.4 pounds Pulse rate:   72 / minute Pulse rhythm:   regular BP sitting:   125 / 94  (left arm) Cuff size:   large  Vitals Entered By: Shary Decamp (August 30, 2006 9:40 AM)               Chief Complaint:  bp check.  History of Present Illness:  Hypertension Follow-Up      This is a 45 year old man who presents for Hypertension follow-up.  The patient denies lightheadedness, urinary frequency, and fatigue.  Associated symptoms include chest pain.  The patient denies the following associated symptoms: exercise intolerance.  Compliance with medications (by patient report) has been near 100%.  The patient reports that dietary compliance has been good.  Missed medication yesterday and ate Kimshi last night. Blood pressure typically runs well.  Current Allergies (reviewed today): No known allergies  Updated/Current Medications (including changes made in today's visit):  HYDROCHLOROTHIAZIDE 25 MG  TABS (HYDROCHLOROTHIAZIDE) 1 by mouth qd KLOR-CON M20 20 MEQ  TBCR (POTASSIUM CHLORIDE CRYS CR) 1 by mouth qd ASPIRIN 81 MG  TBEC (ASPIRIN)  * FISH OIL  * GLUCOSAMINE  * MULTI-VITAMIN    Past Medical History:    Hypertension      Physical Exam  General:     Well-developed,well-nourished,in no acute distress; alert,appropriate and cooperative throughout examination Lungs:     Normal respiratory effort, chest expands symmetrically. Lungs are clear to auscultation, no crackles or wheezes. Heart:     Normal rate and regular rhythm. S1 and S2 normal without gallop, murmur, click, rub or other extra sounds. Pulses:     R and L carotid,radial,femoral,dorsalis pedis and posterior tibial pulses are full and equal bilaterally Extremities:     No clubbing, cyanosis, edema, or deformity noted with normal full range of motion of all joints.      Impression & Recommendations:  Problem # 1:   HYPERTENSION (ICD-401.9) Borderline His updated medication list for this problem includes:    Hydrochlorothiazide 25 Mg Tabs (Hydrochlorothiazide) .Marland Kitchen... 1 by mouth qd  Orders: TLB-BMP (Basic Metabolic Panel-BMET) (80048-METABOL) TLB-Lipid Panel (80061-LIPID)  BP today: 125/94  Labs Reviewed: Creat: 1.3 (04/29/2006) Chol: 184 (11/24/2005)   HDL: 29.5 (11/24/2005)   LDL: DEL (11/24/2005)   TG: 227 (11/24/2005)   Complete Medication List: 1)  Hydrochlorothiazide 25 Mg Tabs (Hydrochlorothiazide) .Marland Kitchen.. 1 by mouth qd 2)  Klor-con M20 20 Meq Tbcr (Potassium chloride crys cr) .Marland Kitchen.. 1 by mouth qd 3)  Aspirin 81 Mg Tbec (Aspirin) 4)  Fish Oil  5)  Glucosamine  6)  Multi-vitamin   Other Orders: T-Antinuclear Antib (ANA) ([id number])   Patient Instructions: 1)  Please schedule a follow-up appointment in 3 months. 2)  Check your Blood Pressure regularly. If it is above: 140/90 you should make an appointment.

## 2010-02-20 NOTE — Progress Notes (Signed)
  Phone Note Outgoing Call Call back at [facility] Phone [phone]   Call placed by: Ardyth Man,  September 01, 2007 3:51 PM Call placed to: Patient Summary of Call: Left message for patient to call office to schedule ov Ardyth Man  September 01, 2007 3:51 PM

## 2010-02-20 NOTE — Assessment & Plan Note (Signed)
Summary: cpx/alr   Vital Signs:  Patient profile:   45 year old male Height:      70 inches Weight:      215 pounds Pulse rate:   78 / minute Pulse rhythm:   regular BP sitting:   110 / 84  (left arm) Cuff size:   large  Vitals Entered By: Shary Decamp (July 20, 2008 1:07 PM) CC: cpx - not fasting Comments  - went to [facility] ED last wk w/CP  - K was low Shary Decamp  July 20, 2008 1:12 PM    History of Present Illness: cpx -  not fasting hospital records reviewed: pt spent several hours in the hospital  07-04-08 due to chest pain, workup was as follows: Chest x-ray negative, CBC, normal  with a  hemoglobin of 15.8. BMP normal with a potassium of 3.3. He was discharged home on supplemental potassium.  His blood work was repeated  on July 16, 2008 was as follows: Total cholesterol 152, TG 404, HDL 30.9, LDL 46, LFTs normal, creatinine 1.1, which is a stable, potassium 3.0 (after few days of K suplement) he already had an outpatient stresstest , reportedly negative  He continue with a atypical chest pain: Feels like gas, occ. it burns, mostly at the anterior chest. Food intake does not change his symptoms, he feels a slightly bloated denies any nausea, vomiting, blood in stools. He has heartburn very rarely, denies dysphasia or odynophagia     Preventive Screening-Counseling & Management  Alcohol-Tobacco     Smoking Status: never  Caffeine-Diet-Exercise     Does Patient Exercise: no      Drug Use:  no.    Current Medications (verified): 1)  Hydrochlorothiazide 25 Mg  Tabs (Hydrochlorothiazide) .Marland Kitchen.. 1 By Mouth Qd 2)  Relpax 40 Mg  Tabs (Eletriptan Hydrobromide) .... Take One Tablet At Onset of Headache and Repeat in Two Hours If Needed. 3)  Ketoprofen 75 Mg  Caps (Ketoprofen) .... Take As Directed 4)  Aspirin 81 Mg  Tbec (Aspirin) .... Take One Tablet By Mouth Once Daily. 5)  Fish Oil   Oil (Fish Oil) .... 5 Tablets Daily 6)  Multivitamins   Tabs (Multiple Vitamin)  .... Take One Tablet By Mouth Once Daily. 7)  Klor-Con M20 20 Meq Cr-Tabs (Potassium Chloride Crys Cr) .Marland Kitchen.. 1 By Mouth Once Daily  Allergies (verified): No Known Drug Allergies  Past History:  Past Medical History: Reviewed history from 07/17/2008 and no changes required. Hypertension COMMON MIGRAINE  (triggers intense light, pork) Hyperlipidemia Chest pain   --exercise ech 2008 normal   --exercise Myoview June 2010: normal  Past Surgical History: Reviewed history from 10/25/2007 and no changes required. left knee scope x 2  Family History: Reviewed history from 10/25/2007 and no changes required. CAD - F, PGF DM - PGM stroke - PGF HTN - F colon Ca - no prostate Ca - no M deceased - lupus  Social History: Reviewed history from 10/25/2007 and no changes required. Married no children Occupation: work in an office Never Smoked Alcohol use-yes (occasionally) Drug use-no Regular exercise-no (works in yard 1-2x/wk) caffeine use - 3-4 sodas qd Occupation:  employed Does Patient Exercise:  no  Review of Systems General:  Denies fever and weight loss. Resp:  Denies cough and shortness of breath. GU:  Denies dysuria, hematuria, and urinary hesitancy. Neuro:  migraines stable . Psych:  Denies anxiety and depression.  Physical Exam  General:  alert and well-developed.  Neck:  supple and no masses. thyroid gland is palpated: small, nontender, no nodular Lungs:  normal respiratory effort, no intercostal retractions, no accessory muscle use, and normal breath sounds.   Heart:  normal rate, regular rhythm, no murmur, and no gallop.   Abdomen:  soft, no distention, no masses, no guarding, and no rigidity.   minimal diffuse abdominal tenderness without mass or rebound Extremities:  no pretibial edema bilaterally  Psych:  Cognition and judgment appear intact. Alert and cooperative with normal attention span and concentration,not anxious appearing and not depressed appearing.      Impression & Recommendations:  Problem # 1:  HEALTH MAINTENANCE EXAM (ICD-V70.0) Td 2007 encouraged diet-exercise  labs as noted above, triglycerides moderately elevated ----> diet needs TSH and repeated CBC   Problem # 2:  ? of GERD (ICD-530.81) the patient has an atypical chest pain, along with some dyspepsia. Trial with prilosec see instructions  patient to let me know if symptoms increase or no better  Problem # 3:  HYPERTENSION (ICD-401.9) well controlled on HCTZ, however, he has persistent  hypokalemia. Switch to Maxzide nurse visit 3 weeks, no charge His updated medication list for this problem includes:    Maxzide-25 37.5-25 Mg Tabs (Triamterene-hctz) ..... One p.o. daily  BP today: 110/84 Prior BP: 130/72 (07/17/2008)  Labs Reviewed: K+: 3.0 (07/16/2008) Creat: : 1.1 (07/16/2008)   Chol: 152 (07/16/2008)   HDL: 30.90 (07/16/2008)   LDL: 107 (08/30/2006)   TG: 404.0 (07/16/2008)  Complete Medication List: 1)  Maxzide-25 37.5-25 Mg Tabs (Triamterene-hctz) .... One p.o. daily 2)  Relpax 40 Mg Tabs (Eletriptan hydrobromide) .... Take one tablet at onset of headache and repeat in two hours if needed. 3)  Ketoprofen 75 Mg Caps (Ketoprofen) .... Take as directed 4)  Aspirin 81 Mg Tbec (Aspirin) .... Take one tablet by mouth once daily. 5)  Fish Oil Oil (Fish oil) .... 5 tablets daily 6)  Multivitamins Tabs (Multiple vitamin) .... Take one tablet by mouth once daily.  Patient Instructions: 1)  stop HCTZ, and potassium-----> start  Maxzide. 2)  Nurse visit in 3 weeks: BP check and blood work 3)  BMP, Dx hypertension 4)  CBC TSH  AND ADx V70 5)  prilosec OTC 20mg  1 before breakfast and one before dinner  6)  Please schedule a follow-up appointment in 4 months . Prescriptions: MAXZIDE-25 37.5-25 MG TABS (TRIAMTERENE-HCTZ) one p.o. daily  #30 x 3   Entered and Authorized by:   Nolon Rod. Paz MD   Signed by:   Nolon Rod. Paz MD on 07/20/2008   Method used:   Electronically  to        [id number] (retail)       [address] [place], [place]  [address]       Ph: [phone]       Fax: [phone]   RxID:   [id number]    Immunization History:  Tetanus/Td Immunization History:    Tetanus/Td:  Historical (01/19/2005)  Influenza Immunization History:    Influenza:  Fluvax Non-MCR (10/25/2007)

## 2010-02-20 NOTE — Progress Notes (Signed)
Summary: directions for med-dr alejandro  Phone Note Call from Patient Call back at Work Phone [phone]   Caller: Patient Summary of Call: patient has a question about  ketoprofen rx he was given directions  say as directed he wants to know - how to take it Initial call taken by: Okey Regal [place],  November 10, 2006 11:15 AM  Follow-up for Phone Call        spoke with pt informed take 1 tab two times a day per michelle  Follow-up by: Kandice Hams,  November 10, 2006 11:42 AM

## 2010-02-20 NOTE — Progress Notes (Signed)
Summary: Due for Office Visit  Phone Note Outgoing Call   Call placed by: Shonna Chock,  October 04, 2007 11:28 AM Call placed to: Patient Summary of Call: Left message on machine for patient to call the office and schedule an appointment with Dr.Lowne,  Dr.Paz, or Dr.Tabori. Last OV 11/2006 with Dr.Alejandro./[facility]  October 04, 2007 11:33 AM

## 2010-04-28 LAB — BASIC METABOLIC PANEL
BUN: 19 mg/dL (ref 6–23)
BUN: 13 mg/dL (ref 6–23)
CO2: 29 mEq/L (ref 19–32)
CO2: 26 mEq/L (ref 19–32)
Calcium: 9.2 mg/dL (ref 8.4–10.5)
Calcium: 8.9 mg/dL (ref 8.4–10.5)
Chloride: 101 mEq/L (ref 96–112)
Chloride: 103 mEq/L (ref 96–112)
Creatinine, Ser: 1.15 mg/dL (ref 0.4–1.5)
Creatinine, Ser: 1.02 mg/dL (ref 0.4–1.5)
GFR calc Af Amer: >60 mL/min (ref >60)
GFR calc Af Amer: >60 mL/min (ref >60)
GFR calc non Af Amer: >60 mL/min (ref >60)
GFR calc non Af Amer: >60 mL/min (ref >60)
Glucose, Bld: 108 mg/dL — ABNORMAL HIGH (ref 70–99)
Glucose, Bld: 128 mg/dL — ABNORMAL HIGH (ref 70–99)
Potassium: 3.3 mEq/L — ABNORMAL LOW (ref 3.5–5.1)
Potassium: 3.2 mEq/L — ABNORMAL LOW (ref 3.5–5.1)
Sodium: 138 mEq/L (ref 135–145)
Sodium: 137 mEq/L (ref 135–145)

## 2010-04-28 LAB — LIPID PANEL
Cholesterol: 159 mg/dL (ref 0–200)
HDL: 30 mg/dL — ABNORMAL LOW (ref >39)
LDL Cholesterol: 76 mg/dL (ref 0–99)
Total CHOL/HDL Ratio: 5.3 RATIO
Triglycerides: 263 mg/dL — ABNORMAL HIGH (ref <150)
VLDL: 53 mg/dL — ABNORMAL HIGH (ref 0–40)

## 2010-04-28 LAB — POCT CARDIAC MARKERS
CKMB, poc: 1.4 ng/mL (ref 1.0–8.0)
CKMB, poc: 1.7 ng/mL (ref 1.0–8.0)
Myoglobin, poc: 115 ng/mL (ref 12–200)
Myoglobin, poc: 122 ng/mL (ref 12–200)
Troponin i, poc: <0.05 ng/mL (ref 0.00–0.09)
Troponin i, poc: <0.05 ng/mL (ref 0.00–0.09)

## 2010-04-28 LAB — CARDIAC PANEL(CRET KIN+CKTOT+MB+TROPI)
CK, MB: 2.1 ng/mL (ref 0.3–4.0)
CK, MB: 2.1 ng/mL (ref 0.3–4.0)
Relative Index: 0.4 (ref 0.0–2.5)
Relative Index: 0.5 (ref 0.0–2.5)
Total CK: 440 U/L — ABNORMAL HIGH (ref 7–232)
Total CK: 474 U/L — ABNORMAL HIGH (ref 7–232)
Troponin I: 0.01 ng/mL (ref 0.00–0.06)
Troponin I: 0.01 ng/mL (ref 0.00–0.06)

## 2010-04-28 LAB — CBC
HCT: 45.3 % (ref 39.0–52.0)
Hemoglobin: 15.8 g/dL (ref 13.0–17.0)
MCHC: 35 g/dL (ref 30.0–36.0)
MCV: 83.9 fL (ref 78.0–100.0)
Platelets: 300 10*3/uL (ref 150–400)
RBC: 5.39 MIL/uL (ref 4.22–5.81)
RDW: 13.4 % (ref 11.5–15.5)
WBC: 6.2 10*3/uL (ref 4.0–10.5)

## 2010-04-28 LAB — APTT: aPTT: 31 seconds (ref 24–37)

## 2010-04-28 LAB — PROTIME-INR
INR: 0.9 (ref 0.00–1.49)
Prothrombin Time: 12.6 seconds (ref 11.6–15.2)

## 2010-04-28 LAB — HEPARIN LEVEL (UNFRACTIONATED)
Heparin Unfractionated: 0.33 IU/mL (ref 0.30–0.70)
Heparin Unfractionated: 0.45 IU/mL (ref 0.30–0.70)

## 2010-06-03 NOTE — Discharge Summary (Signed)
Marcus Santiago, Marcus Santiago NO.:  [id number]   MEDICAL RECORD NO.:  [id number]           PATIENT TYPE:   LOCATION:                                 FACILITY:   PHYSICIAN:  Luis Abed, MD, FACCDATE OF BIRTH:  08/18/1965   DATE OF ADMISSION:  DATE OF DISCHARGE:                               DISCHARGE SUMMARY   ADDENDUM   PRIMARY CARDIOLOGIST:  Bevelyn Buckles. Bensimhon, MD   The patient's followup BMP revealed a potassium of 3.2.  The patient was  given 40 mEq of potassium p.o. and this was repeated 2 hours later.  It  was advised that the patient stay in the hospital overnight and with  followup in the morning and discharge then.  The patient refused to stay  overnight as he states that a lot of times at home his potassium is low  because he is taking hydrochlorothiazide and not taking any potassium  replacement.  The patient requested potassium replacement prior to being  discharged.  I have given him a prescription for potassium 20 mEq 1 p.o.  daily.  He needs to continue the hydrochlorothiazide until followed by  Dr. Arvilla Meres at which time it may be considered to change his  hydrochlorothiazide to another medication that would control his blood  pressure without side effect of hypokalemia.  The patient stated that he  did not wish to stay in the hospital and would follow up with Dr.  Gala Romney and did request the potassium replacement, which was provided  for him.  The patient will follow up with Dr. Gala Romney as directed and  will need to continue medical compliance with potassium and follow up  for any further complaints.  A BMET would be prudent on next visit.      Bettey Mare. Lyman Bishop, NP      Luis Abed, MD, [facility]  Electronically Signed    KML/MEDQ  D:  07/05/2008  T:  07/06/2008  Job:  [id number]

## 2010-06-03 NOTE — Discharge Summary (Signed)
[name], [name] NO.:  [id number]   MEDICAL RECORD NO.:  [id number]          PATIENT TYPE:  EMS   LOCATION:  ED                           FACILITY:  [facility]   PHYSICIAN:  Marcus Santiago, Marcus Santiago, Marcus Santiago:  10-23-1965   DATE OF ADMISSION:  07/04/2008  DATE OF DISCHARGE:  07/05/2008                               DISCHARGE SUMMARY   PRIMARY CARE PHYSICIAN:  Marcus Santiago, Marcus Santiago   PRIMARY CARDIOLOGIST:  Marcus Santiago, Marcus Santiago   PROCEDURES PERFORMED:  During hospitalization, none.   FINAL DISCHARGE DIAGNOSES:  1. Atypical chest pain.  2. Hypertension.  3. Hypertriglyceridemia.   Santiago COURSE:  This is a 45 year old male with history of borderline  hypertension, elevated triglycerides with no known coronary artery  disease who actually had an exercise echocardiogram in 2008, which was  found to be normal.  This was completed because of palpitations.  On day  of admission while at work developed some chest pressure in his chest  and an odd feeling in his arm, it was lasting approximately 1 hour, and  the pain resolved spontaneously later that evening.  He once again had  return of similar symptoms resolving spontaneously.  As a result of  this, the patient came to the emergency room for evaluation, his EKG was  shown to be normal and his first set of cardiac markers were normal.  The patient was seen and examined by Marcus Santiago and admitted  to rule out myocardial infarction.  He believes this pain was mostly  atypical, but he did have a family history and some risk factors.   The patient's cardiac enzymes were cycled.  His troponin was found to be  negative x3 at less than 0.05, 0.01, and 0.01.  Of note, his creatine  kinase was mildly elevated at 440 and then 474.  His CK-MB was within  normal limits throughout hospitalization.  The patient was found to have  hypertriglyceridemia with a triglycerides of 263, cholesterol 159, HDL  30, and LDL of  76.  The patient was continued on heparin until final set  of cardiac markers were found to be negative.  The patient had a BMET  prior to discharge for reevaluation of potassium level as it was low  when he was admitted at 3.3 and repleted with 40 mEq of potassium.  Once  BMET is returned and within normal limits, the patient will be  discharged home.  Please look for addendum for results of BMET.  Currently, the patient has been without complaints of chest pain.   CURRENT LABORATORY DATA:  As stated above, BMET 138, potassium 3.3 on  admission, chloride 101, CO2 of 29, glucose 108, BUN 19, creatinine  1.15.  Troponins were negative x3.  Hemoglobin 15.8, hematocrit 45.3,  white blood cells 6.2, platelets 300.  Cholesterol 159, triglycerides  263, HDL 30, LDL 76.  The patient's chest x-ray on July 04, 2008,  revealed no acute cardiopulmonary process.  Echocardiogram which was  dated Jun 01, 2006, revealed normal ventricular systolic function with  no wall motion abnormalities.  DISCHARGE VITAL SIGNS:  Blood pressure 115/82, pulse 65, respirations  18, temperature 97.4, O2 sat 97% on room air.   DISCHARGE MEDICATIONS:  Hydrochlorothiazide 12.5 mg daily, aspirin 81 mg  daily.   ALLERGIES:  No known drug allergies are listed.   FOLLOWUP PLANS AND APPOINTMENTS:  1. The patient will follow up with a stress Myoview on July 16, 2008,      at 7:45 a.m.  2. The patient will follow up with Marcus Santiago on July 17, 2008, at      2:30 p.m.  3. May need to consider discontinuing hydrochlorothiazide in the      setting of hypokalemia as blood pressure was well controlled      without use of hydrochlorothiazide originally in the emergency      room.  This will be reevaluated per Marcus Santiago on followup      appointment.  4. The patient will follow up with Marcus Santiago, primary care physician for      continued medical management.   Time spent with the patient to include physician time 30  minutes.      Marcus Santiago, Marcus Santiago      Marcus Santiago, Marcus Santiago, Marcus Santiago  Electronically Signed    KML/MEDQ  D:  07/05/2008  T:  07/06/2008  Job:  [id number]   cc:   Marcus Santiago, Marcus Santiago

## 2010-06-03 NOTE — H&P (Signed)
Marcus Santiago, SNOWBALL              ACCOUNT NO.:  [id number]   MEDICAL RECORD NO.:  [id number]          PATIENT TYPE:  EMS   LOCATION:  ED                           FACILITY:  [facility]   PHYSICIAN:  Bevelyn Buckles. Bensimhon, MDDATE OF BIRTH:  02-03-1965   DATE OF ADMISSION:  07/04/2008  DATE OF DISCHARGE:                              HISTORY & PHYSICAL   PRIMARY CARE PHYSICIAN:  Willow Ora, M.D.   CARDIOLOGIST:  Jonelle Sidle, M.D.   REASON FOR ADMISSION:  Chest pain.   HISTORY OF PRESENT ILLNESS:  Mr. Riolo is a 45 year old male with a  history of borderline hypertension and elevated triglycerides.  He does  not have any known history of coronary artery disease.  He was evaluated  in 2008 for palpitations and exercise echocardiogram which was normal.  He had a cardiac monitor which apparently was normal.   Apparently he has been doing pretty well recently.  Today while at work,  he developed some pressure in his chest and had an odd feeling in his  arm.  This lasted for about an hour and resolved spontaneously.  There  were no associated symptoms.  He was able to continue his work without  problem.  Later on this evening he once again had the return of similar  symptoms which once again resolved spontaneously.  He says symptoms in  his arm were not really a pain but just more of an odd feeling.  There  is no shortness of breath, nausea and vomiting.  He said he did feel hot  but the air conditioner was not working.  He came to the emergency room.  EKG was normal.  First set of cardiac markers are normal as pain is now  resolved.   At baseline he says he is fairly active but does no longer exercise  regularly.  Does not have any chest pain or  shortness of breath with  activity.   REVIEW OF SYSTEMS:  He denies any fevers, chills, nausea, vomiting or  cough.  No bleeding.  He has not had any heart failure symptoms.  His  palpitations have resolved.  Remainder of the review of  systems is all  systems are negative  except HPI and problem list.   PROBLEM LIST:  1. Borderline hypertension.  2. History of palpitations with negative stress test and monitor in      2008.  3. Elevated triglycerides.   MEDICATIONS:  1. Hydrochlorothiazide 12.5 a day.  2. Aspirin.  3. Multivitamins.   ALLERGIES:  No known drug allergies.   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  He does  not smoke and no significant alcohol.   FAMILY HISTORY:  Father had a history of coronary artery disease.  He  has had two MIs in his 5s.  He is currently alive and well.  Mother did  not have any history of cardiac disease.  She died from lupus.   PHYSICAL EXAMINATION:  GENERAL:  He is in no acute distress.  He is  sitting up in bed.  VITAL SIGNS:  Respirations unlabored.  Blood pressure is 120/77, heart  rate is 70.  Oxygen saturation  98% on 2 liters.  NECK:  Supple.  There is no JVD.  Carotids are 2+ bilaterally without  bruits.  There is no lymphadenopathy or thyromegaly.  CARDIAC:  Regular rate and rhythm.  No murmurs, rubs or gallops.  PMI is  nondisplaced.  LUNGS:  Clear.  ABDOMEN:  Soft, nontender, nondistended.  No hepatosplenomegaly, no  bruits.  No masses.  Good bowel sounds.  EXTREMITIES:  Warm with no cyanosis, clubbing or edema.  No rash.  NEURO:  Alert and oriented x3.  Cranial nerves II-XII are intact.  Moves  all four extremities without difficulty.  Affect is pleasant.   LABORATORY DATA:  Sodium 138, potassium 3.3, chloride 101, glucose 108,  BUN 19, creatinine 1.15.  Point-of-care markers show troponin less than  0.05.  MBs are negative.  He has had two sets of troponins less than  0.05.  EKG shows sinus rhythm, left anterior fascicular block.  No ST-T  wave abnormalities.  Chest x-ray was normal.   ASSESSMENT:  1. Atypical chest pain with normal axis and exercise echo in 2008.  2. Borderline hypertension.  3. Hypokalemia.    PLAN/DISCUSSION:  His pain is mostly atypical but he does have a family  history and some risk factors.  We talked about the risks  and  indications of cardiac catheterization versus stress testing.  He has  chosen stress testing.  I will keep him overnight for observation, rule  him out for serial cardiac markers.  If his markers are normal and he  does not have any recurrent chest pain, we will plan discharge in the  morning with outpatient stress testing.  Obviously if these markers are  positive, he will need cardiac catheterization.  We will keep him on  heparin and aspirin while he rules out.      Bevelyn Buckles. Bensimhon, MD  Electronically Signed     DRB/MEDQ  D:  07/05/2008  T:  07/05/2008  Job:  [id number]

## 2010-06-03 NOTE — Assessment & Plan Note (Signed)
[facility]                            CARDIOLOGY OFFICE NOTE   Marcus, Santiago                     MRN:          [id number]  DATE:06/28/2006                            DOB:          [birth year]    PRIMARY CARE PHYSICIAN:  Dr. Leanne Chang.   REASON FOR VISIT:  Cardiac followup.   HISTORY OF PRESENT ILLNESS:  I saw Marcus Santiago back in April.  His  history is detailed in my previous note and included palpitations with a  family history of premature cardiovascular disease.  I referred him for  basic cardiac risk stratification via an exercise echocardiogram and  this study revealed no electrocardiographic or echocardiographic  evidence of ischemia.  His maximum workload was 13.7 mets.  He is  presently wearing a CardioNet monitor and had this placed approximately  2 weeks ago.  He states that he has called in some brief mild  palpitations.  I have not yet received any strips for review.  We talked  about the present test results and I encouraged him to continue the full  duration of his monitoring.  We will plan to review these results and  let him know by phone.  At this point, I do not anticipate any further  cardiac testing.   ALLERGIES:  NO KNOWN DRUG ALLERGIES.   PRESENT MEDICATIONS:  1. Hydrochlorothiazide 25 mg p.o. daily.  2. Glucosamine 1000 mg p.o. b.i.d.  3. Omega-3 Fish Oil supplements.  4. Multivitamin daily.  5. Potassium 20 mEq p.o. daily.  6. Aspirin 81 mg p.o. daily.   REVIEW OF SYSTEMS:  As described in the History of Present Illness, he  has had no dizziness or syncope.   EXAMINATION:  Blood pressure is 130/88, heart rate is 62, weight is 213  pounds, the patient is comfortable and in no acute distress.  NECK:  No elevated jugular venous pressure without bruits.  No  thyromegaly is noted.  LUNGS:  Clear, without labored breathing at rest.  CARDIAC EXAM:  A regular rate and rhythm without murmur, rub or gallop.  EXTREMITIES:  No pitting edema.   IMPRESSION AND RECOMMENDATION:  1. History of palpitations, likely benign and not associated with any      dizziness or syncope.  His left ventricular ejection fraction is      normal and there is no frank evidence of ischemia by recent      testing.  We will plan to follow up on his CardioNet monitor      reporting and call him      with the results.  Cardiac followup will be as needed at this      point.  2. Continue followup with Dr. Blossom Hoops.     Jonelle Sidle, MD  Electronically Signed    SGM/MedQ  DD: 06/28/2006  DT: 06/28/2006  Job #: [id number]   cc:   Leanne Chang, M.D.

## 2010-06-03 NOTE — Assessment & Plan Note (Signed)
[facility]                            CARDIOLOGY OFFICE NOTE   Marcus Santiago, Marcus Santiago                     MRN:          [id number]  DATE:05/14/2006                            DOB:          1965/07/06    REASON FOR VISIT:  Palpitations and family history of cardiovascular  disease.   HISTORY OF PRESENT ILLNESS:  Marcus Santiago is a pleasant 45 year old male  with hypertension, family history of premature cardiovascular disease  including myocardial infarction in his father at age 66 and  hyperlipidemia, specifically elevated triglycerides.  He is referred  with a history of palpitations described as a brief fluttering in the  mid lower sternal area.  He has an episode of this 2 years ago that  abated without any specific intervention and more recently has  experienced recurrent symptoms.  He experienced this initially around  the time of a stomach virus and has noted it less frequently since  then, although it seems to be exacerbated after large meals.  He has had  no sense of dizziness, has had no frank syncope.  He also experiences no  dysphagia or reflux symptoms.  From an exertional perspective he is  having no angina or dyspnea on exertion.  His resting electrocardiogram  demonstrates sinus rhythm with a left anterior fascicular block and  possibly left atrial enlargement.  He reports being treated for  hypertension since December.   ALLERGIES:  NO KNOWN DRUG ALLERGIES.   PRESENT MEDICATIONS:  1. Hydrochlorothiazide 25 mg p.o. daily.  2. Glucosamine 1000 mg p.o. b.i.d.  3. Omega 3 fish oil supplements.  4. Multivitamin once daily.  5. Doxycycline.  6. Potassium 20 mEq p.o. daily.  7. Aspirin 81 mg p.o. daily.   REVIEW OF SYSTEMS:  As described in the history of present illness.  He  reports having last week the development of rhinorrhea, cough, fevers  and chills, and is being treated with doxycycline with other lab work  pending.  He does  not report any obvious tick bites or rashes.  He has  no claudication.  No orthopnea, PND, or peripheral edema.   SOCIAL HISTORY:  The patient is married, he has no children, he works as  an Advertising account planner.  He denies any tobacco use, no recreational drug  use.  He drinks alcohol occasionally.  Drinks 4-5 caffeinated beverages  a day.  Exercises with aerobic activity and weights.   FAMILY HISTORY:  Significant for premature cardiovascular disease as  outlined.   EXAMINATION:  Blood pressure is 116/80, heart rate is 84, weighs 208  pounds.  He is a well-developed male in no acute distress.  HEENT:  Conjunctivae, eyelids normal; oropharynx is clear.  NECK:  Supple, no elevated jugular venous pressure, loud bruits; no  thyromegaly is noted.  LUNGS:  Clear without labored breathing at rest.  CARDIAC:  Reveals a regular rate and rhythm, no S3 gallop or loud  murmur.  No pericardial rub.  ABDOMEN:  Soft, no bruits.  No tenderness to palpation.  EXTREMITIES:  Exhibit no significant pitting edema.  SKIN:  Warm and dry.  MUSCULOSKELETAL:  No kyphosis is noted.  NEUROPSYCHIATRIC:  Patient alert and oriented x3, affect is normal.   RECOMMENDATIONS:  1. Palpitations, very brief, lasting only a few seconds and not      associated with dizziness or syncope.  He seems to relate this most      recently to large meals.  There is no exertional component.  His      resting electrocardiogram shows a left anterior fascicular block      which may be relatively nonspecific.  We will plan an event      recorder to try and document if there are any dysrhythmias      involved.  He may just be having occasional ectopic beats.  2. From the perspective of risk stratification he has a family history      of premature cardiovascular disease and also personal history of      hypertension and dyslipidemia.  He has not had any prior risk      stratification and we talked about proceeding with a basic exercise       echocardiogram.  We will plan this as well.  I will have him follow      up in the office over the next month.     Jonelle Sidle, MD  Electronically Signed    SGM/MedQ  DD: 05/14/2006  DT: 05/14/2006  Job #: [id number]   cc:   Leanne Chang, M.D.

## 2012-07-04 ENCOUNTER — Ambulatory Visit (INDEPENDENT_AMBULATORY_CARE_PROVIDER_SITE_OTHER): Payer: BC Managed Care – PPO | Admitting: Internal Medicine

## 2012-07-04 ENCOUNTER — Encounter: Payer: Self-pay | Admitting: Internal Medicine

## 2012-07-04 VITALS — BP 142/88 | HR 76 | Temp 99.0°F | Ht 71.0 in | Wt 209.0 lb

## 2012-07-04 DIAGNOSIS — G43009 Migraine without aura, not intractable, without status migrainosus: Secondary | ICD-10-CM

## 2012-07-04 DIAGNOSIS — I10 Essential (primary) hypertension: Secondary | ICD-10-CM

## 2012-07-04 DIAGNOSIS — R21 Rash and other nonspecific skin eruption: Secondary | ICD-10-CM

## 2012-07-04 DIAGNOSIS — Z0000 Encounter for general adult medical examination without abnormal findings: Secondary | ICD-10-CM

## 2012-07-04 MED ORDER — ELETRIPTAN HYDROBROMIDE 40 MG PO TABS: ORAL_TABLET | ORAL | Status: DC

## 2012-07-04 MED ORDER — LISINOPRIL 20 MG PO TABS: 20.0000 mg | ORAL_TABLET | Freq: Every day | ORAL | Status: DC

## 2012-07-04 MED ORDER — KETOPROFEN 75 MG PO CAPS: 75.0000 mg | ORAL_CAPSULE | Freq: Two times a day (BID) | ORAL | Status: DC | PRN

## 2012-07-04 MED ORDER — KETOCONAZOLE 2 % EX CREA: TOPICAL_CREAM | Freq: Every day | CUTANEOUS | Status: DC

## 2012-07-04 NOTE — Patient Instructions (Addendum)
Change lisinopril from 10 mg to 20 mg one tablet daily Stop potassium supplements Check the  blood pressure 2 or 3 times a week, be sure it is between 110/60 and 140/85. If it is consistently higher or lower, let me know Come back in 10-14 days, fasting for labs: CMP, CBC, TSH, FLP, PSA--- dx V70 Next visit in 6 months as long as the blood pressure is good

## 2012-07-04 NOTE — Assessment & Plan Note (Signed)
Has two types of headaches, request a refill :. The severe HA with nausea (migraine) happens very rarely,takes Relpax w/ good control. Prescription provided. The mild HA not associated with nausea happens one or twice a month, ketoprofen helps  well. Prescription provided. Advise patient to call me if the headaches changes

## 2012-07-04 NOTE — Assessment & Plan Note (Signed)
Last time I saw him ~ 3 years ago was on Maxzide, then off meds. Did have issues w/ hypokalemia Started lisinopril 01-2012 Rx by a UC, no amb BPs. BP today 142/88, plan: Increase lisinipril to 20 mg, labs in 10 days

## 2012-07-04 NOTE — Progress Notes (Signed)
  Subjective:    Patient ID: Marcus Santiago, male    DOB: 1965-08-30, 47 y.o.   MRN: [id number]  HPI Physical exam. In addition we manage his blood pressure, migraines and a rash. See assessment and plan.  Past Medical History: Hypertension COMMON MIGRAINE  (triggers intense light, pork) Hyperlipidemia Chest pain   --exercise ech 2008 normal   --exercise Myoview June 2010: normal  Past Surgical History: left knee scope x 2  Family History: CAD - F (MI at age 7s), PGF DM - PGM stroke - PGF HTN - F colon Ca - no prostate Ca - uncle dx age 33s M deceased - lupus  Social History: Married, no children Occupation: work in an office Never Smoked Alcohol use-yes (occasionally) Drug use-no  Review of Systems Diet--  improving the last few months. Exercise, doing better her last 2 months, has karate classes and works out 3 times a week. Denies chest or shortness or breath No nausea, vomiting, diarrhea or blood in the stools. No dysuria or gross hematuria. No anxiety-depression. Complaining of a rash at the right food for few weeks, no itching.     Objective:   Physical Exam  Musculoskeletal:       Feet:   General -- alert, well-developed, NAD.   Neck --no thyromegaly   Lungs -- normal respiratory effort, no intercostal retractions, no accessory muscle use, and normal breath sounds.   Heart-- normal rate, regular rhythm, no murmur, and no gallop.   Abdomen--soft, non-tender, no distention, no masses, no HSM, no guarding, and no rigidity.   Extremities-- no pretibial edema bilaterally Rectal-- No external abnormalities noted. Normal sphincter tone. No rectal masses or tenderness. Brown stool, Hemoccult negative Prostate:  R lobule larger than left? Not nodular or tender. Neurologic-- alert & oriented X3 and strength normal in all extremities. Psych-- Cognition and judgment appear intact. Alert and cooperative with normal attention span and concentration.  not anxious  appearing and not depressed appearing.        Assessment & Plan:  Rash, fungal? Trial with ketoconazole

## 2012-07-04 NOTE — Assessment & Plan Note (Addendum)
Td 2007 Doing well w/  diet-exercise  + FH prostate ca, DRE done today w/ slt larger R lobe? If PSA elevated will need further eval. labs

## 2012-07-14 ENCOUNTER — Other Ambulatory Visit: Payer: BC Managed Care – PPO

## 2012-07-28 ENCOUNTER — Other Ambulatory Visit (INDEPENDENT_AMBULATORY_CARE_PROVIDER_SITE_OTHER): Payer: BC Managed Care – PPO

## 2012-07-28 DIAGNOSIS — Z0000 Encounter for general adult medical examination without abnormal findings: Secondary | ICD-10-CM

## 2012-07-28 LAB — COMPREHENSIVE METABOLIC PANEL
ALT: 16 U/L (ref 0–53)
AST: 19 U/L (ref 0–37)
Albumin: 4.1 g/dL (ref 3.5–5.2)
Alkaline Phosphatase: 47 U/L (ref 39–117)
BUN: 23 mg/dL (ref 6–23)
CO2: 31 mEq/L (ref 19–32)
Calcium: 8.8 mg/dL (ref 8.4–10.5)
Chloride: 104 mEq/L (ref 96–112)
Creatinine, Ser: 1.2 mg/dL (ref 0.4–1.5)
GFR: 83.56 mL/min (ref >60.00)
Glucose, Bld: 98 mg/dL (ref 70–99)
Potassium: 3.9 mEq/L (ref 3.5–5.1)
Sodium: 136 mEq/L (ref 135–145)
Total Bilirubin: 0.8 mg/dL (ref 0.3–1.2)
Total Protein: 7.1 g/dL (ref 6.0–8.3)

## 2012-07-28 LAB — CBC WITH DIFFERENTIAL/PLATELET
Basophils Absolute: 0 10*3/uL (ref 0.0–0.1)
Basophils Relative: 0.2 % (ref 0.0–3.0)
Eosinophils Absolute: 0 10*3/uL (ref 0.0–0.7)
Eosinophils Relative: 0.7 % (ref 0.0–5.0)
HCT: 43.3 % (ref 39.0–52.0)
Hemoglobin: 14.7 g/dL (ref 13.0–17.0)
Lymphocytes Relative: 31.1 % (ref 12.0–46.0)
Lymphs Abs: 1.7 10*3/uL (ref 0.7–4.0)
MCHC: 34 g/dL (ref 30.0–36.0)
MCV: 86.2 fl (ref 78.0–100.0)
Monocytes Absolute: 0.4 10*3/uL (ref 0.1–1.0)
Monocytes Relative: 6.6 % (ref 3.0–12.0)
Neutro Abs: 3.3 10*3/uL (ref 1.4–7.7)
Neutrophils Relative %: 61.4 % (ref 43.0–77.0)
Platelets: 264 10*3/uL (ref 150.0–400.0)
RBC: 5.03 Mil/uL (ref 4.22–5.81)
RDW: 13.8 % (ref 11.5–14.6)
WBC: 5.4 10*3/uL (ref 4.5–10.5)

## 2012-07-28 LAB — LIPID PANEL
Cholesterol: 183 mg/dL (ref 0–200)
HDL: 38.5 mg/dL — ABNORMAL LOW (ref >39.00)
LDL Cholesterol: 112 mg/dL — ABNORMAL HIGH (ref 0–99)
Total CHOL/HDL Ratio: 5
Triglycerides: 162 mg/dL — ABNORMAL HIGH (ref 0.0–149.0)
VLDL: 32.4 mg/dL (ref 0.0–40.0)

## 2012-07-28 LAB — TSH: TSH: 2.36 u[IU]/mL (ref 0.35–5.50)

## 2012-07-28 LAB — PSA: PSA: 0.49 ng/mL (ref 0.10–4.00)

## 2012-08-02 ENCOUNTER — Encounter: Payer: Self-pay | Admitting: *Deleted

## 2012-08-06 ENCOUNTER — Other Ambulatory Visit: Payer: Self-pay | Admitting: Internal Medicine

## 2012-11-23 ENCOUNTER — Other Ambulatory Visit: Payer: Self-pay | Admitting: Internal Medicine

## 2012-11-24 NOTE — Telephone Encounter (Signed)
Ketoprofen refill sent to pharmacy

## 2013-01-03 ENCOUNTER — Ambulatory Visit (INDEPENDENT_AMBULATORY_CARE_PROVIDER_SITE_OTHER): Payer: BC Managed Care – PPO | Admitting: Internal Medicine

## 2013-01-03 ENCOUNTER — Encounter: Payer: Self-pay | Admitting: Internal Medicine

## 2013-01-03 VITALS — BP 126/83 | HR 60 | Temp 98.0°F | Wt 212.0 lb

## 2013-01-03 DIAGNOSIS — R079 Chest pain, unspecified: Secondary | ICD-10-CM

## 2013-01-03 DIAGNOSIS — Z23 Encounter for immunization: Secondary | ICD-10-CM

## 2013-01-03 DIAGNOSIS — Z0000 Encounter for general adult medical examination without abnormal findings: Secondary | ICD-10-CM

## 2013-01-03 DIAGNOSIS — G43009 Migraine without aura, not intractable, without status migrainosus: Secondary | ICD-10-CM

## 2013-01-03 DIAGNOSIS — I10 Essential (primary) hypertension: Secondary | ICD-10-CM

## 2013-01-03 LAB — BASIC METABOLIC PANEL
BUN: 23 mg/dL (ref 6–23)
CO2: 25 mEq/L (ref 19–32)
Calcium: 9.3 mg/dL (ref 8.4–10.5)
Chloride: 102 mEq/L (ref 96–112)
Creatinine, Ser: 1.2 mg/dL (ref 0.4–1.5)
GFR: 86.69 mL/min (ref >60.00)
Glucose, Bld: 94 mg/dL (ref 70–99)
Potassium: 3.9 mEq/L (ref 3.5–5.1)
Sodium: 136 mEq/L (ref 135–145)

## 2013-01-03 MED ORDER — SUMATRIPTAN SUCCINATE 50 MG PO TABS: 50.0000 mg | ORAL_TABLET | ORAL | Status: DC | PRN

## 2013-01-03 NOTE — Assessment & Plan Note (Signed)
Well-controlled with occasional use of ketoprofen , has been unable to get relpax d/t cost fortunately he has not had any migraines. Has not tried other triptans  Plan: Change to sumatriptan d/t cost

## 2013-01-03 NOTE — Assessment & Plan Note (Signed)
Last labs reviewed with patient, he likes to have more extensive tests, he will bring a list of  labs he likes checked when he comes back for his physical

## 2013-01-03 NOTE — Patient Instructions (Signed)
Get your blood work before you leave  Next visit for a physical exam   fasting, in 6 months Please make an appointment    Check the  blood pressure 2 or 3 times a month be sure it is between 110/60 and 140/85. Ideal blood pressure is 120/80. If it is consistently higher or lower, let me know  Try sumatriptan INSTEAD OF relpax  for migraines    If you need more information about diet and  hypertension, the American Heart Association is a great resource online at:  [employer]101.pl

## 2013-01-03 NOTE — Progress Notes (Signed)
   Subjective:    Patient ID: Marcus Santiago, male    DOB: 03-25-1965, 47 y.o.   MRN: [id number]  HPI Office visit Hypertension, good medication compliance, not ambulatory BPs Headaches-- fortunately, no recent migraines, has been unable to get Relpax due to cost. Has used ketoprofen sporadically. Also one-week history of chest pain located near the left shoulder, no radiation, last one or 2 seconds, usually at rest. No shoulder pain per se.  Past Medical History  Diagnosis Date  . Hypertension   . Headache   . Hyperlipemia   . Chest pain     (-) stress test 2008, 2010   Past Surgical History  Procedure Laterality Date  . Knee arthroscopy      L x 2      Family History: CAD - F (MI at age 80s), PGF DM - PGM stroke - PGF HTN - F colon Ca - no prostate Ca - uncle dx age 72s M deceased - lupus  Social History: Married, no children Occupation: work in an office Never Smoked Alcohol use-yes (occasionally) Drug use-no   Review of Systems Diet--needs improvement Exercise--very active. Denies any nausea, vomiting, diarrhea. No lower extremity edema or difficulty breathing.    Objective:   Physical Exam BP 126/83  Pulse 60  Temp(Src) 98 F (36.7 C)  Wt 212 lb (96.163 kg)  SpO2 98% General -- alert, well-developed, NAD.   Lungs -- normal respiratory effort, no intercostal retractions, no accessory muscle use, and normal breath sounds.  Chest wall-- no TTP Heart-- normal rate, regular rhythm, no murmur.  Extremities-- no pretibial edema bilaterally ;Shoulders symmetric, range of motion normal Neurologic--  alert & oriented X3. Speech normal, gait normal, strength normal in all extremities.  Psych-- Cognition and judgment appear intact. Cooperative with normal attention span and concentration. No anxious appearing , no depressed appearing.      Assessment & Plan:  Chest pain, see history of present illness ; atypical, recommend observation

## 2013-01-03 NOTE — Assessment & Plan Note (Signed)
Controlled, check a BMP 

## 2013-01-03 NOTE — Progress Notes (Signed)
Pre visit review using our clinic review tool, if applicable. No additional management support is needed unless otherwise documented below in the visit note. 

## 2013-02-17 ENCOUNTER — Other Ambulatory Visit: Payer: Self-pay | Admitting: Internal Medicine

## 2013-07-06 ENCOUNTER — Encounter: Payer: BC Managed Care – PPO | Admitting: Internal Medicine

## 2013-10-25 ENCOUNTER — Other Ambulatory Visit: Payer: Self-pay

## 2013-10-25 ENCOUNTER — Other Ambulatory Visit: Payer: Self-pay | Admitting: Internal Medicine

## 2013-10-25 MED ORDER — LISINOPRIL 20 MG PO TABS: ORAL_TABLET | ORAL | Status: DC

## 2013-12-04 ENCOUNTER — Other Ambulatory Visit: Payer: Self-pay | Admitting: Internal Medicine

## 2014-03-25 ENCOUNTER — Other Ambulatory Visit: Payer: Self-pay | Admitting: Internal Medicine

## 2014-05-09 ENCOUNTER — Ambulatory Visit (INDEPENDENT_AMBULATORY_CARE_PROVIDER_SITE_OTHER): Payer: BLUE CROSS/BLUE SHIELD | Admitting: Internal Medicine

## 2014-05-09 ENCOUNTER — Encounter: Payer: Self-pay | Admitting: Internal Medicine

## 2014-05-09 VITALS — BP 128/84 | HR 80 | Temp 98.7°F | Wt 217.2 lb

## 2014-05-09 DIAGNOSIS — J069 Acute upper respiratory infection, unspecified: Secondary | ICD-10-CM | POA: Diagnosis not present

## 2014-05-09 DIAGNOSIS — I10 Essential (primary) hypertension: Secondary | ICD-10-CM | POA: Diagnosis not present

## 2014-05-09 DIAGNOSIS — R51 Headache: Secondary | ICD-10-CM

## 2014-05-09 DIAGNOSIS — R519 Headache, unspecified: Secondary | ICD-10-CM

## 2014-05-09 LAB — BASIC METABOLIC PANEL
BUN: 16 mg/dL (ref 6–23)
CO2: 33 mEq/L — ABNORMAL HIGH (ref 19–32)
Calcium: 9.6 mg/dL (ref 8.4–10.5)
Chloride: 101 mEq/L (ref 96–112)
Creatinine, Ser: 1.14 mg/dL (ref 0.40–1.50)
GFR: 88.8 mL/min (ref >60.00)
Glucose, Bld: 95 mg/dL (ref 70–99)
Potassium: 3.8 mEq/L (ref 3.5–5.1)
Sodium: 138 mEq/L (ref 135–145)

## 2014-05-09 MED ORDER — HYDROCODONE-HOMATROPINE 5-1.5 MG/5ML PO SYRP: 5.0000 mL | ORAL_SOLUTION | Freq: Every evening | ORAL | Status: DC | PRN

## 2014-05-09 MED ORDER — LISINOPRIL 20 MG PO TABS: 20.0000 mg | ORAL_TABLET | Freq: Every day | ORAL | Status: DC

## 2014-05-09 NOTE — Progress Notes (Signed)
Subjective:    Patient ID: Marcus Santiago, male    DOB: 05/31/1965, 49 y.o.   MRN: [id number]  DOS:  05/09/2014 Type of visit - description : acute visit Interval history:  Mr. Marcus Santiago is a 49 year old male with a history of migraine and hypertension in today with a five day history of dry nonproductive cough, sore throat, subjective sinus pain, and congestion. He also notes a headache today and mentions an episode of myalgia/arthralgia on Sunday. He additionally mentions a subjective low grade fever over the past few days. He denies any rhinorrhea, eye discharge, shortness of breath, wheezing, or hemoptysis. He has been taking Mucinex with relief. He also has been taking Nyquil and notes that it worked for the first three days, but the past two days it has not been as effective. He took Claritin last night which yielded mild relief. Patient notes no recent travel and some coworkers were sick , unclear what was the dx. Additionally, patient is here for refills of medications. He denies any issues with current regimen of medications (out of BP med since 01-2014).  Review of Systems  Constitutional: No fever, chills.  HEENT: As per HPI Respiratory: No wheezing or difficulty breathing.  Cardiovascular: No CP, leg swelling or palpitations GI: no nausea, vomiting, diarrhea or abdominal pain.  No blood in the stools.   GU: No dysuria, gross hematuria, difficulty urinating. No urinary urgency or frequency. Musculoskeletal: Myalgia/arthralgia  Allergic, immunologic: Environmental allergies Neurological: Mild headache, no dizziness or diplopia    Past Medical History  Diagnosis Date  . Hypertension   . Headache   . Hyperlipemia   . Chest pain     (-) stress test 2008, 2010    Past Surgical History  Procedure Laterality Date  . Knee arthroscopy      L x 2    History   Social History  . Marital Status: Married    Spouse Name: N/A  . Number of Children: N/A  . Years of Education:  N/A   Occupational History  . Not on file.   Social History Main Topics  . Smoking status: Never Smoker   . Smokeless tobacco: Not on file  . Alcohol Use: No  . Drug Use: Not on file  . Sexual Activity: Not on file   Other Topics Concern  . Not on file   Social History Narrative        Medication List       This list is accurate as of: 05/09/14 11:59 PM.  Always use your most recent med list.               aspirin 81 MG tablet  Take 81 mg by mouth daily.     CLARITIN PO  Take 1 tablet by mouth daily as needed.     eletriptan 40 MG tablet  Commonly known as:  RELPAX  Take 1 tablet at onset of headache. May repeat in 2 hours if headache persists or recurs.     fish oil-omega-3 fatty acids 1000 MG capsule  Take 2 g by mouth daily.     GLUCOSAMINE CHONDROITIN JOINT PO  Take 1,200 mg by mouth daily at 6 (six) AM.     HYDROcodone-homatropine 5-1.5 MG/5ML syrup  Commonly known as:  HYCODAN  Take 5 mLs by mouth at bedtime as needed for cough.     ketoprofen 75 MG capsule  Commonly known as:  ORUDIS  Take 1 capsule (75 mg total)  by mouth 2 (two) times daily as needed.     lisinopril 20 MG tablet  Commonly known as:  PRINIVIL,ZESTRIL  Take 1 tablet (20 mg total) by mouth daily.     multivitamin tablet  Take 1 tablet by mouth daily.           Objective:   Physical Exam BP 128/84 mmHg  Pulse 80  Temp(Src) 98.7 F (37.1 C) (Oral)  Wt 217 lb 4 oz (98.544 kg)  SpO2 98%  General:   Well developed, well nourished . NAD.  HEENT:  Normocephalic . Face symmetric, atraumatic Cerumen buildup in left ear, TMs normal s/p cerumen removal. Throat nonerythematous Lungs:  CTA B Normal respiratory effort, no intercostal retractions, no accessory muscle use. Heart: RRR,  no murmur.  Skin: Not pale. Not jaundice Neurologic:  alert & oriented X3.  Speech normal, gait appropriate for age and unassisted Psych--  Cognition and judgment appear intact.  Cooperative  with normal attention span and concentration.  Behavior appropriate. No anxious or depressed appearing.      Assessment & Plan:    Mr. Marcus Santiago has symptoms consistent with an Upper Respiratory Infection. Most likely viral. Complicated by seasonal allergies. Plan: Patient advised to rest, drink fluids, and use tylenol/Mucinex DM as needed. Hydrocodone prescribed prn at bedtime for persistent cough. Patient also advised on use of Nasocort or Flonase for congestion. Patient informed that if symptoms do not improve over the next 10 days or if they significantly worsen to let us know.

## 2014-05-09 NOTE — Progress Notes (Signed)
Pre visit review using our clinic review tool, if applicable. No additional management support is needed unless otherwise documented below in the visit note. 

## 2014-05-09 NOTE — Patient Instructions (Signed)
Rest, fluids , tylenol If  cough, take Mucinex DM twice a day as needed  If you continue coughing, use hydrocodone at bedtime , will cause drowsiness If nasal  congestion use OTC Nasocort or Flonase : 2 nasal sprays on each side of the nose daily until you feel better   Call if not gradually better over the next  10 days Call anytime if the symptoms are severe  Get your blood work before you leave Restart your blood pressure medication See you in few weeks for your physical

## 2014-05-10 MED ORDER — LISINOPRIL 20 MG PO TABS: 20.0000 mg | ORAL_TABLET | Freq: Every day | ORAL | Status: DC

## 2014-05-10 MED ORDER — KETOPROFEN 75 MG PO CAPS: 75.0000 mg | ORAL_CAPSULE | Freq: Two times a day (BID) | ORAL | Status: DC | PRN

## 2014-05-10 MED ORDER — ELETRIPTAN HYDROBROMIDE 40 MG PO TABS: ORAL_TABLET | ORAL | Status: DC

## 2014-05-10 NOTE — Assessment & Plan Note (Signed)
Well-controlled despite not taking lisinopril since January. Plan: We will check BMP , restart lisinopril, he already has an appointment is scheduled in few weeks, will recheck a BMP at that time

## 2014-05-14 ENCOUNTER — Telehealth: Payer: Self-pay | Admitting: *Deleted

## 2014-05-14 DIAGNOSIS — R51 Headache: Principal | ICD-10-CM

## 2014-05-14 DIAGNOSIS — R519 Headache, unspecified: Secondary | ICD-10-CM

## 2014-05-14 NOTE — Telephone Encounter (Signed)
Prior authorization for Relpax initiated. Awaiting determination from [employer]. JG//CMA

## 2014-05-23 NOTE — Telephone Encounter (Signed)
That is ok 

## 2014-05-23 NOTE — Telephone Encounter (Signed)
[employer] will only pay for 36 tablets per 90 days. Is it ok to send in new script for this amount? Previous script was 90 tabs per 90 days. Please advise. JG//CMA

## 2014-05-24 MED ORDER — ELETRIPTAN HYDROBROMIDE 40 MG PO TABS: ORAL_TABLET | ORAL | Status: DC

## 2014-05-24 NOTE — Telephone Encounter (Signed)
New script sent in. JG//CMA

## 2014-05-24 NOTE — Addendum Note (Signed)
Addended by: Amado CoeGLOVER, Kinga Cassar A on: 05/24/2014 07:15 AM   Modules accepted: Orders

## 2014-06-04 ENCOUNTER — Telehealth: Payer: Self-pay | Admitting: Internal Medicine

## 2014-06-04 NOTE — Telephone Encounter (Signed)
Pre Visit letter sent  °

## 2014-06-22 ENCOUNTER — Encounter: Payer: Self-pay | Admitting: *Deleted

## 2014-06-22 ENCOUNTER — Telehealth: Payer: Self-pay | Admitting: *Deleted

## 2014-06-22 NOTE — Telephone Encounter (Signed)
Pre-Visit Call completed with patient and chart updated.   Pre-Visit Info documented in Specialty Comments under [employer].    

## 2014-06-25 ENCOUNTER — Encounter: Payer: Self-pay | Admitting: Internal Medicine

## 2014-06-25 ENCOUNTER — Ambulatory Visit (INDEPENDENT_AMBULATORY_CARE_PROVIDER_SITE_OTHER): Payer: BLUE CROSS/BLUE SHIELD | Admitting: Internal Medicine

## 2014-06-25 VITALS — BP 128/84 | HR 98 | Temp 98.7°F | Ht 71.5 in | Wt 223.2 lb

## 2014-06-25 DIAGNOSIS — Z0000 Encounter for general adult medical examination without abnormal findings: Secondary | ICD-10-CM

## 2014-06-25 NOTE — Progress Notes (Signed)
Subjective:    Patient ID: Marcus FreestoneStephen M Lim, male    DOB: 07/09/1965, 49 y.o.   MRN: [id number]  DOS:  06/25/2014 Type of visit - description : CPX Interval history:  Patient is a 49 year old male with a history of HTN and migraines in today for an annual physical exam.  HTN: Patient notes good compliance to Lisinopril. Patient had been off Lisinopril from January until April when we restarted it, and patient has had no issues with tolerance. Patient has not been doing great with diet and exercise and recently went on vacation after which he noticed minor weight gain. Patient has since re-started exercising.   Migraines: Patient has a history of severe and mild migraines which have been controlled with Relpax for severe and Ketoprofen for mild. Notes he no longer takes Relpax because it has become too expensive and he rarely has severe headaches.    Review of Systems   Constitutional: No fever. No chills. No unusual sweats. Recent unexplained 6 lb weight gain in past 6 weeks.  HEENT: No dental problems, no ear discharge, no facial swelling, no voice changes. No eye discharge, no eye redness, nointolerance to light   Respiratory: No wheezing, no difficulty breathing. No cough, no mucus production  Cardiovascular: No CP, no leg swelling, no palpitations  GI: no nausea, no vomiting, no diarrhea, no abdominal pain.  No blood in the stools. No dysphagia, no odynophagia    Endocrine: No polyphagia, no polyuria, no polydipsia  GU: No dysuria, gross hematuria, difficulty urinating. No urinary urgency, no frequency.  Musculoskeletal: No joint swellings or unusual aches or pains  Skin: No change in the color of the skin, pallor, no rash  Allergic, immunologic: No environmental allergies, no food allergies  Neurological: No dizziness no syncope. No headaches. No diplopia, no slurred speech, no motor deficits, no facial numbness  Hematological: No enlarged lymph nodes, no easy bruising,  no unusual bleedings  Psychiatry: No suicidal ideas, no hallucinations, no beavior problems, no confusion.  No unusual/severe anxiety, no depression     Past Medical History  Diagnosis Date  . Hypertension   . Headache   . Hyperlipemia   . Chest pain     (-) stress test 2008, 2010    Past Surgical History  Procedure Laterality Date  . Knee arthroscopy      L x 2    History   Social History  . Marital Status: Married    Spouse Name: N/A  . Number of Children: 0  . 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 Smoking status: Never Smoker   . Smokeless tobacco: Not on file  . Alcohol Use: No  . Drug Use: Not on file  . Sexual Activity: Not on file   Other Topics Concern  . Not on file   Social History Narrative     Family History  Problem Relation Age of Onset  . Colon cancer Neg Hx   . Prostate cancer Other     uncle  . CAD Father     F, GF, uncle   . Stroke Neg Hx   . Diabetes Neg Hx   . Lupus Mother        Medication List       This list is accurate as of: 06/25/14  4:28 PM.  Always use your most recent med list.  aspirin 81 MG tablet  Take 81 mg by mouth daily.     CLARITIN PO  Take 1 tablet by mouth daily as needed.     fish oil-omega-3 fatty acids 1000 MG capsule  Take 2 g by mouth daily.     GLUCOSAMINE CHONDROITIN JOINT PO  Take 1,200 mg by mouth daily at 6 (six) AM.     ketoprofen 75 MG capsule  Commonly known as:  ORUDIS  Take 1 capsule (75 mg total) by mouth 2 (two) times daily as needed.     lisinopril 20 MG tablet  Commonly known as:  PRINIVIL,ZESTRIL  Take 1 tablet (20 mg total) by mouth daily.     multivitamin tablet  Take 1 tablet by mouth daily.           Objective:   Physical Exam BP 128/84 mmHg  Pulse 98  Temp(Src) 98.7 F (37.1 C) (Oral)  Ht 5' 11.5" (1.816 m)  Wt 223 lb 4 oz (101.266 kg)  BMI 30.71 kg/m2  SpO2 94%  General:     Well developed, well nourished. NAD.  Neck:  Full range of motion. Supple. No thyromegaly HEENT:  Normocephalic. Face symmetric, atraumatic TMs clear and non-bulging bilaterally. Lungs:  CTA B Normal respiratory effort, no intercostal retractions, no accessory muscle use. Heart: RRR, no murmur.  No pretibial edema bilaterally  Abdomen:  Not distended, soft, non-tender. No rebound or rigidity. No mass, organomegaly Skin: Exposed areas without rash. Not pale. Not jaundice Neurologic:  alert & oriented X3.  Speech normal, gait appropriate for age and unassisted Psych: Cognition and judgment appear intact.  Cooperative with normal attention span and concentration.  Behavior appropriate. No anxious or depressed appearing. Rectal:  External abnormalities: none. Normal sphincter tone. No rectal masses or tenderness.  Prostate: Prostate gland firm and smooth, no enlargement, nodularity, tenderness, mass, asymmetry or induration.         Assessment & Plan:   (Patient seen along with   Merilynn Finland, medical student)   Hypertension:  Well-controlled on Lisinopril 20 mg. BP today 128/84. Patient notes recent weight gain over vacation but is doing well now with diet and exercise to lose the weight. Patient notes no side effects from medication. Plan: No changes to medication. Will check CMP, CBC, and FLP today.  Migraines: Patient is currently only using Ketoprofen for headaches because Relpax is too expensive now. Notes severe headaches are rare and the mild headaches are controlled effectively with Ketoprofen.  Plan:  Continue current medication of Ketoprofen prn. Patient to let us know if symptoms worsen.

## 2014-06-25 NOTE — Progress Notes (Signed)
Pre visit review using our clinic review tool, if applicable. No additional management support is needed unless otherwise documented below in the visit note. 

## 2014-06-25 NOTE — Patient Instructions (Signed)
Please schedule labs to be done within few days (fasting)   Check the  blood pressure  monthly   Be sure your blood pressure is between 110/65 and  145/85.  if it is consistently higher or lower, let me know

## 2014-06-25 NOTE — Assessment & Plan Note (Signed)
Td 2007  diet/exercise, had gained weight over vacation but has resumed D&E  + FH prostate ca, DRE done today was unremarkable. Will check PSA today. Labs EKG today -- unchanged from baseline

## 2014-06-27 ENCOUNTER — Other Ambulatory Visit (INDEPENDENT_AMBULATORY_CARE_PROVIDER_SITE_OTHER): Payer: BLUE CROSS/BLUE SHIELD

## 2014-06-27 DIAGNOSIS — E785 Hyperlipidemia, unspecified: Secondary | ICD-10-CM

## 2014-06-27 DIAGNOSIS — R7989 Other specified abnormal findings of blood chemistry: Secondary | ICD-10-CM | POA: Diagnosis not present

## 2014-06-27 DIAGNOSIS — Z0000 Encounter for general adult medical examination without abnormal findings: Secondary | ICD-10-CM | POA: Diagnosis not present

## 2014-06-27 DIAGNOSIS — R748 Abnormal levels of other serum enzymes: Secondary | ICD-10-CM

## 2014-06-27 LAB — COMPREHENSIVE METABOLIC PANEL
ALT: 66 U/L — ABNORMAL HIGH (ref 0–53)
AST: 42 U/L — ABNORMAL HIGH (ref 0–37)
Albumin: 4.4 g/dL (ref 3.5–5.2)
Alkaline Phosphatase: 57 U/L (ref 39–117)
BUN: 18 mg/dL (ref 6–23)
CO2: 29 mEq/L (ref 19–32)
Calcium: 9.3 mg/dL (ref 8.4–10.5)
Chloride: 103 mEq/L (ref 96–112)
Creatinine, Ser: 1.15 mg/dL (ref 0.40–1.50)
GFR: 87.85 mL/min (ref >60.00)
Glucose, Bld: 97 mg/dL (ref 70–99)
Potassium: 4.1 mEq/L (ref 3.5–5.1)
Sodium: 137 mEq/L (ref 135–145)
Total Bilirubin: 0.6 mg/dL (ref 0.2–1.2)
Total Protein: 7.3 g/dL (ref 6.0–8.3)

## 2014-06-27 LAB — CBC WITH DIFFERENTIAL/PLATELET
Basophils Absolute: 0 10*3/uL (ref 0.0–0.1)
Basophils Relative: 0.2 % (ref 0.0–3.0)
Eosinophils Absolute: 0.1 10*3/uL (ref 0.0–0.7)
Eosinophils Relative: 1.4 % (ref 0.0–5.0)
HCT: 45.5 % (ref 39.0–52.0)
Hemoglobin: 15.4 g/dL (ref 13.0–17.0)
Lymphocytes Relative: 28.4 % (ref 12.0–46.0)
Lymphs Abs: 2 10*3/uL (ref 0.7–4.0)
MCHC: 34 g/dL (ref 30.0–36.0)
MCV: 85.1 fl (ref 78.0–100.0)
Monocytes Absolute: 0.5 10*3/uL (ref 0.1–1.0)
Monocytes Relative: 6.5 % (ref 3.0–12.0)
Neutro Abs: 4.5 10*3/uL (ref 1.4–7.7)
Neutrophils Relative %: 63.5 % (ref 43.0–77.0)
Platelets: 317 10*3/uL (ref 150.0–400.0)
RBC: 5.35 Mil/uL (ref 4.22–5.81)
RDW: 13.8 % (ref 11.5–15.5)
WBC: 7.1 10*3/uL (ref 4.0–10.5)

## 2014-06-27 LAB — LDL CHOLESTEROL, DIRECT: Direct LDL: 53 mg/dL

## 2014-06-27 LAB — LIPID PANEL
Cholesterol: 166 mg/dL (ref 0–200)
HDL: 34.4 mg/dL — ABNORMAL LOW (ref >39.00)
Total CHOL/HDL Ratio: 5
Triglycerides: 418 mg/dL — ABNORMAL HIGH (ref 0.0–149.0)

## 2014-06-27 LAB — PSA: PSA: 0.59 ng/mL (ref 0.10–4.00)

## 2014-06-29 NOTE — Addendum Note (Signed)
Addended by: Dorette Grate on: 06/29/2014 03:19 PM   Modules accepted: Orders

## 2014-08-28 ENCOUNTER — Other Ambulatory Visit (INDEPENDENT_AMBULATORY_CARE_PROVIDER_SITE_OTHER): Payer: BLUE CROSS/BLUE SHIELD

## 2014-08-28 DIAGNOSIS — E785 Hyperlipidemia, unspecified: Secondary | ICD-10-CM | POA: Diagnosis not present

## 2014-08-28 DIAGNOSIS — R748 Abnormal levels of other serum enzymes: Secondary | ICD-10-CM

## 2014-08-28 LAB — AST: AST: 22 U/L (ref 0–37)

## 2014-08-28 LAB — LIPID PANEL
Cholesterol: 161 mg/dL (ref 0–200)
HDL: 35.4 mg/dL — ABNORMAL LOW (ref >39.00)
NonHDL: 125.54
Total CHOL/HDL Ratio: 5
Triglycerides: 296 mg/dL — ABNORMAL HIGH (ref 0.0–149.0)
VLDL: 59.2 mg/dL — ABNORMAL HIGH (ref 0.0–40.0)

## 2014-08-28 LAB — ALT: ALT: 26 U/L (ref 0–53)

## 2014-08-28 LAB — LDL CHOLESTEROL, DIRECT: Direct LDL: 50 mg/dL

## 2014-11-06 ENCOUNTER — Telehealth: Payer: Self-pay | Admitting: Internal Medicine

## 2014-11-06 NOTE — Telephone Encounter (Signed)
Caller name: Milana NaStephen Guiffre   Relationship to patient: Self   Can be reached: [phone]    Reason for call: Pt sent a mychart message asking the following : "Can I send the Dr pictures I have of the diagnosis papers? They would not make paper copies for me"   Please advise.    Thanks.

## 2014-11-06 NOTE — Telephone Encounter (Signed)
Answered Pt via MyChart.

## 2014-11-07 ENCOUNTER — Ambulatory Visit: Payer: BLUE CROSS/BLUE SHIELD | Admitting: Internal Medicine

## 2014-11-07 ENCOUNTER — Ambulatory Visit (INDEPENDENT_AMBULATORY_CARE_PROVIDER_SITE_OTHER): Payer: BLUE CROSS/BLUE SHIELD | Admitting: Internal Medicine

## 2014-11-07 ENCOUNTER — Telehealth: Payer: Self-pay | Admitting: Internal Medicine

## 2014-11-07 ENCOUNTER — Encounter: Payer: Self-pay | Admitting: Internal Medicine

## 2014-11-07 VITALS — BP 126/78 | HR 97 | Temp 98.5°F | Ht 71.5 in | Wt 219.2 lb

## 2014-11-07 DIAGNOSIS — R197 Diarrhea, unspecified: Secondary | ICD-10-CM

## 2014-11-07 DIAGNOSIS — Z09 Encounter for follow-up examination after completed treatment for conditions other than malignant neoplasm: Secondary | ICD-10-CM

## 2014-11-07 DIAGNOSIS — Z0289 Encounter for other administrative examinations: Secondary | ICD-10-CM

## 2014-11-07 NOTE — Progress Notes (Signed)
Pre visit review using our clinic review tool, if applicable. No additional management support is needed unless otherwise documented below in the visit note. 

## 2014-11-07 NOTE — Patient Instructions (Signed)
Drink plenty of clear fluids  Pepto-Bismol as needed if diarrhea  Get a probiotic called FLORASTOR 1 tablet daily for 3-4 weeks  Skip your dose of lisinopril tomorrow  Call if no back to normal in few days

## 2014-11-07 NOTE — Assessment & Plan Note (Signed)
Diarrhea: Diarrhea in the context of visiting [place], probably traveler's diarrhea. He is better, status post Cipro. Recommend no more antibiotics, increase fluids, Pepto-Bismol, probiotics, Skip one dose of lisinopril as he may be slightly dehydrated. See AVS

## 2014-11-07 NOTE — Progress Notes (Signed)
Subjective:    Patient ID: Marcus Santiago, male    DOB: 04/18/1965, 49 y.o.   MRN: [id number]  DOS:  11/07/2014 Type of visit - description : Acute visit Interval history: Went to  [place] from October 5  to the  18. By October 12 she got sick with nausea, diarrhea, chills. No fever or blood in the stools, + generalized abdominal pain Saw the doctor and theyrx gentamicin 1 and ciprofloxacin for 5 days. Here for follow-up. Also started Pepto-Bismol as needed  Review of Systems  In the last few days is feeling better, appetite is increasing, no abdominal pain, no fever, no nausea, vomiting. His stools are not watery, they are still slightly soft.  Past Medical History  Diagnosis Date  . Hypertension   . Headache   . Hyperlipemia   . Chest pain     (-) stress test 2008, 2010    Past Surgical History  Procedure Laterality Date  . Knee arthroscopy      L x 2    Social History   Social History  . Marital Status: Married    Spouse Name: N/A  . Number of Children: 0  . 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 Smoking status: Never Smoker   . Smokeless tobacco: Not on file  . Alcohol Use: No  . Drug Use: Not on file  . Sexual Activity: Not on file   Other Topics Concern  . Not on file   Social History Narrative        Medication List       This list is accurate as of: 11/07/14  6:12 PM.  Always use your most recent med list.               aspirin 81 MG tablet  Take 81 mg by mouth daily.     CLARITIN PO  Take 1 tablet by mouth daily as needed.     fish oil-omega-3 fatty acids 1000 MG capsule  Take 2 g by mouth daily.     GLUCOSAMINE CHONDROITIN JOINT PO  Take 1,200 mg by mouth daily at 6 (six) AM.     lisinopril 20 MG tablet  Commonly known as:  PRINIVIL,ZESTRIL  Take 1 tablet (20 mg total) by mouth daily.     multivitamin tablet  Take 1 tablet by mouth daily.             Objective:   Physical Exam BP 126/78 mmHg  Pulse 97  Temp(Src) 98.5 F (36.9 C) (Oral)  Ht 5' 11.5" (1.816 m)  Wt 219 lb 4 oz (99.451 kg)  BMI 30.16 kg/m2  SpO2 97% General:   Well developed, well nourished . NAD.  HEENT:  Normocephalic . Face symmetric, atraumatic Lungs:  CTA B Normal respiratory effort, no intercostal retractions, no accessory muscle use. Heart: RRR,  no murmur.  no pretibial edema bilaterally  Abdomen:  Not distended, soft, non-tender. No rebound or rigidity. + Bowel sounds. Skin: Not pale. Not jaundice Neurologic:  alert & oriented X3.  Speech normal, gait appropriate for age and unassisted Psych--  Cognition and judgment appear intact.  Cooperative with normal attention span and concentration.  Behavior appropriate. No anxious or depressed appearing.    Assessment & Plan:   Assessment> HTN Hyperlipidemia Chest pain : (-) stress test 2008, 2010  Plan Diarrhea: Diarrhea in the context of visiting [place], probably traveler's diarrhea.  He is better, status post Cipro. Recommend no more antibiotics, increase fluids, Pepto-Bismol, probiotics, Skip one dose of lisinopril as he may be slightly dehydrated. See AVS

## 2014-11-09 NOTE — Telephone Encounter (Signed)
No charge. 

## 2014-11-09 NOTE — Telephone Encounter (Signed)
Pt was no show 11/06/13 10:45am for mychart appt, pt came in at 11:04 and rescheduled for later the same day, charge or no charge?

## 2014-11-30 ENCOUNTER — Encounter: Payer: Self-pay | Admitting: Internal Medicine

## 2014-11-30 ENCOUNTER — Ambulatory Visit (INDEPENDENT_AMBULATORY_CARE_PROVIDER_SITE_OTHER): Payer: BLUE CROSS/BLUE SHIELD | Admitting: Internal Medicine

## 2014-11-30 VITALS — BP 128/76 | HR 93 | Temp 98.0°F | Ht 72.0 in | Wt 220.2 lb

## 2014-11-30 DIAGNOSIS — Z23 Encounter for immunization: Secondary | ICD-10-CM | POA: Diagnosis not present

## 2014-11-30 DIAGNOSIS — R1011 Right upper quadrant pain: Secondary | ICD-10-CM | POA: Diagnosis not present

## 2014-11-30 DIAGNOSIS — Z09 Encounter for follow-up examination after completed treatment for conditions other than malignant neoplasm: Secondary | ICD-10-CM

## 2014-11-30 NOTE — Patient Instructions (Signed)
Please go to the lab and provide a urine sample  We'll schedule a ultrasound of the abdomen  Next visit by June 2017 for a physical exam.

## 2014-11-30 NOTE — Progress Notes (Signed)
Subjective:    Patient ID: Marcus Santiago, male    DOB: May 19, 1965, 49 y.o.   MRN: [id number]  DOS:  11/30/2014 Type of visit - description : Acute visit Interval history: Was recently seen with diarrhea: Symptoms subsided Today he reports a history of right-sided abdominal pain on and off since August 2016.  Pain seems to be "superficial", sometimes worse when he stretches, not related to eating. No radiation to his back or anywhere else. Has not taken any medication for it.  Review of Systems Denies fever chills No weight loss No nausea, vomiting, or blood in the stools.  no rash in the abdomen No dysuria or gross hematuria Past Medical History  Diagnosis Date  . Hypertension   . Headache   . Hyperlipemia   . Chest pain     (-) stress test 2008, 2010    Past Surgical History  Procedure Laterality Date  . Knee arthroscopy      L x 2    Social History   Social History  . Marital Status: Married    Spouse Name: N/A  . Number of Children: 0  . 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 Smoking status: Never Smoker   . Smokeless tobacco: Not on file  . Alcohol Use: No  . Drug Use: Not on file  . Sexual Activity: Not on file   Other Topics Concern  . Not on file   Social History Narrative        Medication List       This list is accurate as of: 11/30/14 11:59 PM.  Always use your most recent med list.               aspirin 81 MG tablet  Take 81 mg by mouth daily.     CLARITIN PO  Take 1 tablet by mouth daily as needed.     fish oil-omega-3 fatty acids 1000 MG capsule  Take 2 g by mouth daily.     GLUCOSAMINE CHONDROITIN JOINT PO  Take 1,200 mg by mouth daily at 6 (six) AM.     lisinopril 20 MG tablet  Commonly known as:  PRINIVIL,ZESTRIL  Take 1 tablet (20 mg total) by mouth daily.     multivitamin tablet  Take 1 tablet by mouth daily.           Objective:   Physical  Exam  Abdominal:     BP 128/76 mmHg  Pulse 93  Temp(Src) 98 F (36.7 C) (Oral)  Ht 6' (1.829 m)  Wt 220 lb 4 oz (99.905 kg)  BMI 29.86 kg/m2  SpO2 95% General:   Well developed, well nourished . NAD.  HEENT:  Normocephalic . Face symmetric, atraumatic Lungs:  CTA B Normal respiratory effort, no intercostal retractions, no accessory muscle use. Heart: RRR,  no murmur.  no pretibial edema bilaterally  Abdomen:  Not distended, soft, non-tender. No rebound or rigidity. No mass,organomegaly Skin: Not pale. Not jaundice Neurologic:  alert & oriented X3.  Speech normal, gait appropriate for age and unassisted Psych--  Cognition and judgment appear intact.  Cooperative with normal attention span and concentration.  Behavior appropriate. No anxious or depressed appearing.    Assessment & Plan:   Assessment> HTN Hyperlipidemia Chest pain : (-) stress test 2008, 2010  PLAN  Right-sided abdominal pain: Exam is benign, no clear etiology on clinical grounds, DDX includes a  gallbladder, kidney problem, MSK versus others. Plan: UA, ultrasound, call if not improving gradually or if the symptoms increase

## 2014-11-30 NOTE — Progress Notes (Signed)
Pre visit review using our clinic review tool, if applicable. No additional management support is needed unless otherwise documented below in the visit note. 

## 2014-12-01 LAB — URINALYSIS, ROUTINE W REFLEX MICROSCOPIC
Bilirubin Urine: NEGATIVE
Glucose, UA: NEGATIVE
Hgb urine dipstick: NEGATIVE
Ketones, ur: NEGATIVE
Leukocytes, UA: NEGATIVE
Nitrite: NEGATIVE
Protein, ur: NEGATIVE
Specific Gravity, Urine: 1.02 (ref 1.001–1.035)
pH: 5 (ref 5.0–8.0)

## 2014-12-01 LAB — URINE CULTURE
Colony Count: NO GROWTH
Organism ID, Bacteria: NO GROWTH

## 2014-12-02 NOTE — Assessment & Plan Note (Signed)
Right-sided abdominal pain: Exam is benign, no clear etiology on clinical grounds, DDX includes a gallbladder, kidney problem, MSK versus others. Plan: UA, ultrasound, call if not improving gradually or if the symptoms increase

## 2015-01-02 ENCOUNTER — Ambulatory Visit (HOSPITAL_BASED_OUTPATIENT_CLINIC_OR_DEPARTMENT_OTHER)
Admission: RE | Admit: 2015-01-02 | Discharge: 2015-01-02 | Disposition: A | Discharge status: Home / Self Care | Payer: BLUE CROSS/BLUE SHIELD | Source: Ambulatory Visit | Attending: Internal Medicine | Admitting: Internal Medicine

## 2015-01-02 DIAGNOSIS — E785 Hyperlipidemia, unspecified: Secondary | ICD-10-CM | POA: Diagnosis not present

## 2015-01-02 DIAGNOSIS — R1011 Right upper quadrant pain: Secondary | ICD-10-CM | POA: Insufficient documentation

## 2015-01-02 DIAGNOSIS — I10 Essential (primary) hypertension: Secondary | ICD-10-CM | POA: Insufficient documentation

## 2015-01-09 ENCOUNTER — Other Ambulatory Visit: Payer: Self-pay | Admitting: Internal Medicine

## 2015-03-21 ENCOUNTER — Other Ambulatory Visit: Payer: Self-pay | Admitting: Internal Medicine

## 2015-05-13 ENCOUNTER — Encounter: Payer: Self-pay | Admitting: Internal Medicine

## 2015-05-13 ENCOUNTER — Ambulatory Visit (INDEPENDENT_AMBULATORY_CARE_PROVIDER_SITE_OTHER): Payer: BLUE CROSS/BLUE SHIELD | Admitting: Internal Medicine

## 2015-05-13 VITALS — BP 154/98 | HR 72 | Temp 98.2°F | Resp 14 | Wt 221.2 lb

## 2015-05-13 DIAGNOSIS — I10 Essential (primary) hypertension: Secondary | ICD-10-CM | POA: Diagnosis not present

## 2015-05-13 DIAGNOSIS — Z09 Encounter for follow-up examination after completed treatment for conditions other than malignant neoplasm: Secondary | ICD-10-CM

## 2015-05-13 DIAGNOSIS — M25511 Pain in right shoulder: Secondary | ICD-10-CM

## 2015-05-13 NOTE — Progress Notes (Signed)
Pre visit review using our clinic review tool, if applicable. No additional management support is needed unless otherwise documented below in the visit note. 

## 2015-05-13 NOTE — Assessment & Plan Note (Signed)
Shoulder pain, R: Essentially normal exam, recommend physical therapy, Tylenol or ibuprofen as needed Eyelid discoloration: Observation for now, if not better will call for a dermatology referral HTN: BP 154/98, recheck 140/90. Out of medications last week but restarted 3 days ago. No ambulatory BPs. Plan: No change, monitor BPs, low salt. RTC as a schedule 06/2015

## 2015-05-13 NOTE — Patient Instructions (Addendum)
Will refer you to PT  IBUPROFEN (Advil or Motrin) 200 mg 2 tablets every 6 hours as needed for pain.  Always take it with food because may cause gastritis and ulcers.  If you notice nausea, stomach pain, change in the color of stools --->  Stop the medicine and let us know   Watch your salt intake  Check the  blood pressure    weekly   Be sure your blood pressure is between 110/65 and  135/85. If it is consistently higher or lower, let me know   If the eye discoloration is not gone in 3-4 weeks, please call for a referral

## 2015-05-13 NOTE — Progress Notes (Signed)
Subjective:    Patient ID: Marcus Santiago, male    DOB: August 01, 1965, 50 y.o.   MRN: [id number]  DOS:  05/13/2015 Type of visit - description : acute Interval history: R shoulder pain since ~ 01-2015, initially closer to the base of the neck now more at the deltoid area.Pain is on and off, mild, no injury, has not taken any medications specifically for the pain  HTN: BP is slightly elevated, he ran out of lisinopril last week but restarted 3 days ago. No ambulatory BPs  Also, wife noted that discoloration at the eyelid to 4 weeks ago. No other symptoms.  Review of Systems   Past Medical History  Diagnosis Date  . Hypertension   . Headache   . Hyperlipemia   . Chest pain     (-) stress test 2008, 2010    Past Surgical History  Procedure Laterality Date  . Knee arthroscopy      L x 2    Social History   Social History  . Marital Status: Married    Spouse Name: N/A  . Number of Children: 0  . 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 Smoking status: Never Smoker   . Smokeless tobacco: Not on file  . Alcohol Use: No  . Drug Use: Not on file  . Sexual Activity: Not on file   Other Topics Concern  . Not on file   Social History Narrative        Medication List       This list is accurate as of: 05/13/15  5:34 PM.  Always use your most recent med list.               aspirin 81 MG tablet  Take 81 mg by mouth daily.     CLARITIN PO  Take 1 tablet by mouth daily as needed.     fish oil-omega-3 fatty acids 1000 MG capsule  Take 2 g by mouth daily.     GLUCOSAMINE CHONDROITIN JOINT PO  Take 1,200 mg by mouth daily at 6 (six) AM.     lisinopril 20 MG tablet  Commonly known as:  PRINIVIL,ZESTRIL  Take 1 tablet (20 mg total) by mouth daily.     multivitamin tablet  Take 1 tablet by mouth daily.           Objective:   Physical Exam  HENT:  Head:     BP 154/98 mmHg  Pulse 72   Temp(Src) 98.2 F (36.8 C) (Oral)  Resp 14  Wt 221 lb 3.2 oz (100.336 kg)  SpO2 97% General:   Well developed, well nourished . NAD.  HEENT:  Normocephalic . Face symmetric, atraumatic MSK: Shoulders symmetric, range of motion normal, no TTP, mild pain if any with full range of motion of the right shoulder. Skin: Not pale. Not jaundice Neurologic:  alert & oriented X3.  Speech normal, gait appropriate for age and unassisted Motor exam symmetric Psych--  Cognition and judgment appear intact.  Cooperative with normal attention span and concentration.  Behavior appropriate. No anxious or depressed appearing.      Assessment & Plan:   Assessment> HTN Hyperlipidemia Chest pain : (-) stress test 2008, 2010  PLAN   Shoulder pain, R: Essentially normal exam, recommend physical therapy, Tylenol or ibuprofen as needed Eyelid discoloration: Observation for now, if not better will call for a dermatology referral HTN: BP  154/98, recheck 140/90. Out of medications last week but restarted 3 days ago. No ambulatory BPs. Plan: No change, monitor BPs, low salt. RTC as a schedule 06/2015

## 2015-05-16 ENCOUNTER — Ambulatory Visit: Payer: BLUE CROSS/BLUE SHIELD

## 2015-05-19 ENCOUNTER — Telehealth: Payer: Self-pay | Admitting: Internal Medicine

## 2015-05-20 NOTE — Telephone Encounter (Signed)
Please advise, as I do not see this on pt's current medication list.

## 2015-05-21 NOTE — Telephone Encounter (Signed)
Chart review, does need to use NSAIDs from time to time, I sent a prescription for orudis #40 no refills.

## 2015-05-27 DIAGNOSIS — M25511 Pain in right shoulder: Secondary | ICD-10-CM | POA: Diagnosis not present

## 2015-05-27 DIAGNOSIS — M25561 Pain in right knee: Secondary | ICD-10-CM | POA: Diagnosis not present

## 2015-06-28 ENCOUNTER — Ambulatory Visit (INDEPENDENT_AMBULATORY_CARE_PROVIDER_SITE_OTHER): Payer: BLUE CROSS/BLUE SHIELD | Admitting: Internal Medicine

## 2015-06-28 ENCOUNTER — Encounter: Payer: Self-pay | Admitting: Internal Medicine

## 2015-06-28 VITALS — BP 132/82 | HR 79 | Temp 98.4°F | Ht 72.0 in | Wt 222.4 lb

## 2015-06-28 DIAGNOSIS — Z0000 Encounter for general adult medical examination without abnormal findings: Secondary | ICD-10-CM | POA: Diagnosis not present

## 2015-06-28 DIAGNOSIS — Z114 Encounter for screening for human immunodeficiency virus [HIV]: Secondary | ICD-10-CM

## 2015-06-28 DIAGNOSIS — L989 Disorder of the skin and subcutaneous tissue, unspecified: Secondary | ICD-10-CM

## 2015-06-28 NOTE — Progress Notes (Signed)
Pre visit review using our clinic review tool, if applicable. No additional management support is needed unless otherwise documented below in the visit note. 

## 2015-06-28 NOTE — Patient Instructions (Signed)
  GO TO THE FRONT DESK Schedule your next appointment for a  Physical exam in 1 year  Schedule labs to be done within few days     Check the  blood pressure 2 or 3 times a month  Be sure your blood pressure is between 110/65 and  140/85. If it is consistently higher or lower, let me know

## 2015-06-28 NOTE — Assessment & Plan Note (Addendum)
Td 2007: States is going to get the booster @ the pharmacy + FH prostate ca, DRE   PSA  2016 (-). CCS: Start at age 50, no FH.  Reports  is doing better with diet, he remains active. Labs: CMP, FLP, HIV

## 2015-06-28 NOTE — Progress Notes (Signed)
Subjective:    Patient ID: Marcus Santiago, male    DOB: Sep 16, 1965, 50 y.o.   MRN: [id number]  DOS:  06/28/2015 Type of visit - description : CPX Interval history: No major concerns, trying to eat healthier, still going to the gym 3 or 4 times a week. Ambulatory BPs in the 130s over 80.      Review of Systems  Constitutional: No fever. No chills. No unexplained wt changes. No unusual sweats  HEENT: No dental problems, no ear discharge, no facial swelling, no voice changes. No eye discharge, no eye  redness , no  intolerance to light   Respiratory: No wheezing , no  difficulty breathing. No cough , no mucus production  Cardiovascular: No CP, no leg swelling , no  Palpitations  GI: no nausea, no vomiting, no diarrhea , no  abdominal pain.  No blood in the stools. No dysphagia, no odynophagia    Endocrine: No polyphagia, no polyuria , no polydipsia  GU: No dysuria, gross hematuria, difficulty urinating. No urinary urgency, no frequency.  Musculoskeletal: No joint swellings or unusual aches or pains  Skin: Still has a skin eye lid discoloration  Allergic, immunologic: No environmental allergies , no  food allergies  Neurological: No dizziness no  syncope. No headaches. No diplopia, no slurred, no slurred speech, no motor deficits, no facial  Numbness  Hematological: No enlarged lymph nodes, no easy bruising , no unusual bleedings  Psychiatry: No suicidal ideas, no hallucinations, no beavior problems, no confusion.  No unusual/severe anxiety, no depression  Past Medical History  Diagnosis Date  . Hypertension   . Headache   . Hyperlipemia   . Chest pain     (-) stress test 2008, 2010    Past Surgical History  Procedure Laterality Date  . Knee arthroscopy      L x 2    Social History   Social History  . Marital Status: Married    Spouse Name: N/A  . Number of Children: 0  . Years of Education: N/A   Occupational History  . tax preparationb    Social  History Main Topics  . Smoking status: Never Smoker   . Smokeless tobacco: Not on file  . Alcohol Use: No  . Drug Use: Not on file  . Sexual Activity: Not on file   Other Topics Concern  . Not on file   Social History Narrative     Family History  Problem Relation Age of Onset  . Colon cancer Neg Hx   . Prostate cancer Other     uncle  . CAD Father     F first MI ~ 86, GF, uncle   . Stroke Other     GF  . Diabetes Neg Hx   . Lupus Mother        Medication List       This list is accurate as of: 06/28/15 11:59 PM.  Always use your most recent med list.               aspirin 81 MG tablet  Take 81 mg by mouth daily.     CLARITIN PO  Take 1 tablet by mouth daily as needed.     fish oil-omega-3 fatty acids 1000 MG capsule  Take 2 g by mouth daily.     GLUCOSAMINE CHONDROITIN JOINT PO  Take 1,200 mg by mouth daily at 6 (six) AM.     ketoprofen 75 MG capsule  Commonly known  as:  ORUDIS  TAKE 1 CAPSULE BY MOUTH TWICE DAILY AS NEEDED     lisinopril 20 MG tablet  Commonly known as:  PRINIVIL,ZESTRIL  Take 1 tablet (20 mg total) by mouth daily.     multivitamin tablet  Take 1 tablet by mouth daily.       Family History  Problem Relation Age of Onset  . Colon cancer Neg Hx   . Prostate cancer Other     uncle  . CAD Father     F first MI ~ 5250, GF, uncle   . Stroke Other     GF  . Diabetes Neg Hx   . Lupus Mother        Objective:   Physical Exam BP 132/82 mmHg  Pulse 79  Temp(Src) 98.4 F (36.9 C) (Oral)  Ht 6' (1.829 m)  Wt 222 lb 6 oz (100.869 kg)  BMI 30.15 kg/m2  SpO2 98%  General:   Well developed, well nourished . NAD.  Neck: No  thyromegaly  HEENT:  Normocephalic . Face symmetric, atraumatic Lungs:  CTA B Normal respiratory effort, no intercostal retractions, no accessory muscle use. Heart: RRR,  no murmur.  No pretibial edema bilaterally  Abdomen:  Not distended, soft, non-tender. No rebound or rigidity.   Skin: See previous  entry, has a hyperpigmented area and the left upper eyelid. Essentially unchanged Neurologic:  alert & oriented X3.  Speech normal, gait appropriate for age and unassisted Strength symmetric and appropriate for age.  Psych: Cognition and judgment appear intact.  Cooperative with normal attention span and concentration.  Behavior appropriate. No anxious or depressed appearing.    Assessment & Plan:   Assessment> HTN Hyperlipidemia Chest pain : (-) stress test 2008, 2010  PLAN   HTN: Controlled, continue lisinopril, refill as needed. Eyelid lesion: Unchanged, to be sure we will send to dermatology. RTC one year

## 2015-06-29 NOTE — Assessment & Plan Note (Signed)
HTN: Controlled, continue lisinopril, refill as needed. Eyelid lesion: Unchanged, to be sure we will send to dermatology. RTC one year

## 2015-07-02 ENCOUNTER — Other Ambulatory Visit (INDEPENDENT_AMBULATORY_CARE_PROVIDER_SITE_OTHER): Payer: BLUE CROSS/BLUE SHIELD

## 2015-07-02 DIAGNOSIS — R7989 Other specified abnormal findings of blood chemistry: Secondary | ICD-10-CM

## 2015-07-02 DIAGNOSIS — Z0000 Encounter for general adult medical examination without abnormal findings: Secondary | ICD-10-CM

## 2015-07-02 DIAGNOSIS — Z114 Encounter for screening for human immunodeficiency virus [HIV]: Secondary | ICD-10-CM | POA: Diagnosis not present

## 2015-07-02 LAB — COMPREHENSIVE METABOLIC PANEL
ALT: 26 U/L (ref 0–53)
AST: 23 U/L (ref 0–37)
Albumin: 4.3 g/dL (ref 3.5–5.2)
Alkaline Phosphatase: 49 U/L (ref 39–117)
BUN: 20 mg/dL (ref 6–23)
CO2: 26 mEq/L (ref 19–32)
Calcium: 9.3 mg/dL (ref 8.4–10.5)
Chloride: 104 mEq/L (ref 96–112)
Creatinine, Ser: 1.1 mg/dL (ref 0.40–1.50)
GFR: 92.08 mL/min (ref >60.00)
Glucose, Bld: 94 mg/dL (ref 70–99)
Potassium: 3.7 mEq/L (ref 3.5–5.1)
Sodium: 139 mEq/L (ref 135–145)
Total Bilirubin: 0.6 mg/dL (ref 0.2–1.2)
Total Protein: 7.5 g/dL (ref 6.0–8.3)

## 2015-07-02 LAB — LDL CHOLESTEROL, DIRECT: Direct LDL: 51 mg/dL

## 2015-07-02 LAB — LIPID PANEL
Cholesterol: 194 mg/dL (ref 0–200)
HDL: 35.3 mg/dL — ABNORMAL LOW (ref >39.00)
Total CHOL/HDL Ratio: 6
Triglycerides: 451 mg/dL — ABNORMAL HIGH (ref 0.0–149.0)

## 2015-07-03 LAB — HIV ANTIBODY (ROUTINE TESTING W REFLEX): HIV 1&2 Ab, 4th Generation: NONREACTIVE

## 2015-07-05 ENCOUNTER — Encounter: Payer: Self-pay | Admitting: Internal Medicine

## 2015-07-11 ENCOUNTER — Other Ambulatory Visit: Payer: Self-pay

## 2015-07-11 DIAGNOSIS — E785 Hyperlipidemia, unspecified: Secondary | ICD-10-CM

## 2015-08-13 ENCOUNTER — Encounter: Payer: Self-pay | Admitting: Internal Medicine

## 2015-08-21 ENCOUNTER — Other Ambulatory Visit: Payer: Self-pay | Admitting: Internal Medicine

## 2015-08-21 DIAGNOSIS — M62838 Other muscle spasm: Secondary | ICD-10-CM | POA: Diagnosis not present

## 2015-08-23 ENCOUNTER — Ambulatory Visit (INDEPENDENT_AMBULATORY_CARE_PROVIDER_SITE_OTHER): Payer: BLUE CROSS/BLUE SHIELD | Admitting: Physician Assistant

## 2015-08-23 ENCOUNTER — Encounter: Payer: Self-pay | Admitting: Physician Assistant

## 2015-08-23 VITALS — BP 140/82 | HR 97 | Temp 98.8°F | Resp 18 | Ht 72.0 in | Wt 220.0 lb

## 2015-08-23 DIAGNOSIS — M545 Low back pain, unspecified: Secondary | ICD-10-CM

## 2015-08-23 MED ORDER — NAPROXEN 500 MG PO TABS: 500.0000 mg | ORAL_TABLET | Freq: Two times a day (BID) | ORAL | 0 refills | Status: DC

## 2015-08-23 NOTE — Progress Notes (Signed)
   08/23/2015 11:57 AM   DOB: 23-Jan-1965 / MRN: [id number]  SUBJECTIVE:  Marcus Santiago is a 50 y.o. male presenting for midline LBP that started 4 days ago.  Denies any major trauma to the area.  Has had problems with his back in the past.  Today he denies numbness, leg weakness, changes in sensation and incontinence.  No history of smoking.   He has No Known Allergies.   He  has a past medical history of Allergy; Chest pain; Headache; Hyperlipemia; and Hypertension.    He  reports that he has never smoked. He has never used smokeless tobacco. He reports that he does not drink alcohol. He  has no sexual activity history on file. The patient  has a past surgical history that includes Knee arthroscopy.  His family history includes CAD in his father; Diabetes in his paternal grandmother; Heart disease in his father and paternal grandfather; Hyperlipidemia in his father; Hypertension in his father; Lupus in his mother; Prostate cancer in his other; Stroke in his other and paternal grandfather.  ROS   Per HPI.   The problem list and medications were reviewed and updated by myself where necessary and exist elsewhere in the encounter.   OBJECTIVE:  BP 140/82 (BP Location: Right Arm, Patient Position: Sitting, Cuff Size: Small)   Pulse 97   Temp 98.8 F (37.1 C) (Oral)   Resp 18   Ht 6' (1.829 m)   Wt 220 lb (99.8 kg)   SpO2 96%   BMI 29.84 kg/m   Physical Exam  Constitutional: He is oriented to person, place, and time. He appears well-developed. He does not appear ill.  Eyes: Conjunctivae and EOM are normal. Pupils are equal, round, and reactive to light.  Cardiovascular: Normal rate.   Pulmonary/Chest: Effort normal.  Abdominal: He exhibits no distension.  Musculoskeletal: Normal range of motion.  Neurological: He is alert and oriented to person, place, and time. No cranial nerve deficit. Coordination normal.  Skin: Skin is warm and dry. He is not diaphoretic.  Psychiatric: He has  a normal mood and affect.  Nursing note and vitals reviewed.     No results found for this or any previous visit (from the past 72 hour(s)).  No results found.  ASSESSMENT AND PLAN  Claudis was seen today for spasms.  Diagnoses and all orders for this visit:  Bilateral low back pain without sciatica: NO red flags.  Will provide rx strength NSAID.  RTC in 3-4 days if not improveing and will consider imaging, steroid or both.  -     naproxen (NAPROSYN) 500 MG tablet; Take 1 tablet (500 mg total) by mouth 2 (two) times daily with a meal.    The patient is advised to call or return to clinic if he does not see an improvement in symptoms, or to seek the care of the closest emergency department if he worsens with the above plan.   Deliah Boston, MHS, PA-C [facility] 08/23/2015 11:57 AM

## 2015-08-23 NOTE — Patient Instructions (Signed)
     IF you received an x-ray today, you will receive an invoice from [facility]. Please contact [facility] at [phone] with questions or concerns regarding your invoice.   IF you received labwork today, you will receive an invoice from [facility]. Please contact [facility] at [phone] with questions or concerns regarding your invoice.   Our billing staff will not be able to assist you with questions regarding bills from these companies.  You will be contacted with the lab results as soon as they are available. The fastest way to get your results is to activate your My Chart account. Instructions are located on the last page of this paperwork. If you have not heard from us regarding the results in 2 weeks, please contact this office.      

## 2015-08-26 ENCOUNTER — Ambulatory Visit (INDEPENDENT_AMBULATORY_CARE_PROVIDER_SITE_OTHER): Payer: BLUE CROSS/BLUE SHIELD

## 2015-08-26 ENCOUNTER — Ambulatory Visit (INDEPENDENT_AMBULATORY_CARE_PROVIDER_SITE_OTHER): Payer: BLUE CROSS/BLUE SHIELD | Admitting: Physician Assistant

## 2015-08-26 VITALS — BP 124/88 | HR 101 | Temp 98.0°F | Resp 17 | Ht 72.0 in | Wt 224.0 lb

## 2015-08-26 DIAGNOSIS — M545 Low back pain, unspecified: Secondary | ICD-10-CM

## 2015-08-26 DIAGNOSIS — M47816 Spondylosis without myelopathy or radiculopathy, lumbar region: Secondary | ICD-10-CM | POA: Diagnosis not present

## 2015-08-26 MED ORDER — MELOXICAM 7.5 MG PO TABS: 7.5000 mg | ORAL_TABLET | Freq: Every day | ORAL | 0 refills | Status: DC | PRN

## 2015-08-26 MED ORDER — METAXALONE 800 MG PO TABS: 800.0000 mg | ORAL_TABLET | Freq: Three times a day (TID) | ORAL | 0 refills | Status: DC

## 2015-08-26 NOTE — Patient Instructions (Addendum)
  Referral to ortho has been sent. They will be contacting you. Take mobic (1-2 tablets) every morning for the next two weeks You can use skelaxin up to three times a day Use ice to the affected area 4-5 times a day for 20 minutes at a time. Return to clinic if symptoms worsen, do not improve, or as needed   IF you received an x-ray today, you will receive an invoice from [facility]. Please contact [facility] at [phone] with questions or concerns regarding your invoice.   IF you received labwork today, you will receive an invoice from [facility]. Please contact [facility] at [phone] with questions or concerns regarding your invoice.   Our billing staff will not be able to assist you with questions regarding bills from these companies.  You will be contacted with the lab results as soon as they are available. The fastest way to get your results is to activate your My Chart account. Instructions are located on the last page of this paperwork. If you have not heard from us regarding the results in 2 weeks, please contact this office.

## 2015-08-26 NOTE — Progress Notes (Signed)
MELQUISEDEC JOURNEY  MRN: [id number] DOB: 1965-11-06  Subjective:  Marcus Santiago is a 50 y.o. male seen in office today for a chief complaint of dull back pain x 1 week.  One week ago pt was getting dressed when he noticed a pain in his mid lower back.He went to an urgent care five days ago and got a muscle relaxer and tylenol. Pt notes the pain got better but three days ago, it came back. He came here on 08/23/15 and was given naproxen. Notes that for the past two days, the pain has been worse in the morning and has improved throughout the day with movement. Today, pt notes the back pain was very painful when he woke up and has remained a constant dull ache throughout the day.   Has associated worsening of pain in lower back with deep breath.  Denies recent trauma, radiating symptoms, numbness, tingling, bowel or bladder incontinence, and gait disturbance.   Has tried flexeril, tylenol, naproxen, diclofenac cream with minimal relief.   Pt has history of low back pain.    Review of Systems  HENT: Negative for congestion.   Respiratory: Negative for cough and shortness of breath.   Genitourinary: Negative for difficulty urinating, dysuria, frequency and urgency.   Family history: father has RA  Patient Active Problem List   Diagnosis Date Noted  . PCP NOTES >>> 11/07/2014  . Annual physical exam 07/04/2012  . HYPERLIPIDEMIA 10/25/2007  . COMMON MIGRAINE 10/15/2006  . Essential hypertension 08/30/2006    Current Outpatient Prescriptions on File Prior to Visit  Medication Sig Dispense Refill  . aspirin 81 MG tablet Take 81 mg by mouth daily.    . fish oil-omega-3 fatty acids 1000 MG capsule Take 2 g by mouth daily.    . Glucos-Chondroit-Hyaluron-MSM (GLUCOSAMINE CHONDROITIN JOINT PO) Take 1,200 mg by mouth daily at 6 (six) AM.    . ketoprofen (ORUDIS) 75 MG capsule TAKE 1 CAPSULE BY MOUTH TWICE DAILY AS NEEDED 40 capsule 0  . lisinopril (PRINIVIL,ZESTRIL) 20 MG tablet Take 1 tablet  (20 mg total) by mouth daily. 30 tablet 12  . Loratadine (CLARITIN PO) Take 1 tablet by mouth daily as needed.    . Multiple Vitamin (MULTIVITAMIN) tablet Take 1 tablet by mouth daily.    . naproxen (NAPROSYN) 500 MG tablet Take 1 tablet (500 mg total) by mouth 2 (two) times daily with a meal. 21 tablet 0   No current facility-administered medications on file prior to visit.     No Known Allergies  Objective:  BP 124/88 (BP Location: Right Arm, Patient Position: Sitting, Cuff Size: Large)   Pulse (!) 101   Temp 98 F (36.7 C) (Oral)   Resp 17   Ht 6' (1.829 m)   Wt 224 lb (101.6 kg)   SpO2 97%   BMI 30.38 kg/m   Physical Exam  Constitutional: He is oriented to person, place, and time and well-developed, well-nourished, and in no distress.  HENT:  Head: Normocephalic and atraumatic.  Eyes: Conjunctivae are normal.  Neck: Normal range of motion.  Pulmonary/Chest: Effort normal.  Abdominal: There is no CVA tenderness.  Musculoskeletal:       Lumbar back: He exhibits decreased range of motion (with lateral and forward flexion, and extension) and tenderness ( to palpation of muscles bilaterally, more pronounced on right side , minimal tenderness noted midline). He exhibits no swelling.  Neurological: He is alert and oriented to person, place, and time. He has  normal sensation and normal strength. Gait normal.  Skin: Skin is warm and dry.  Psychiatric: Affect normal.  Vitals reviewed.  Dg Lumbar Spine 2-3 Views  Result Date: 08/26/2015 CLINICAL DATA:  Acute bilateral lower back pain. EXAM: LUMBAR SPINE - 2-3 VIEW COMPARISON:  Abdomen and pelvis CT dated 03/04/2005. FINDINGS: Five non-rib-bearing lumbar vertebrae. Mild anterior and mild-to-moderate lateral spur formation on the right at the L2-3 level. Minimal anterior spur formation at the T11-12 level. No fractures, pars defects or subluxations. IMPRESSION: Minimal degenerative changes.  No acute abnormality. Electronically Signed    By: Beckie SaltsSteven  Reid M.D.   On: 08/26/2015 16:17    Assessment and Plan :  1. Acute bilateral low back pain without sciatica - DG Lumbar Spine 2-3 Views; Future - Ambulatory referral to Orthopedic Surgery - meloxicam (MOBIC) 7.5 MG tablet; Take 1-2 tablets (7.5-15 mg total) by mouth daily as needed for pain.  Dispense: 60 tablet; Refill: 0 - metaxalone (SKELAXIN) 800 MG tablet; Take 1 tablet (800 mg total) by mouth 3 (three) times daily.  Dispense: 60 tablet; Refill: 0 -Return to clinic if symptoms worsen, do not improve, or as needed   Benjiman CoreBrittany Lataysha Vohra PA-C  [facility] 08/26/2015 3:44 PM

## 2015-08-30 DIAGNOSIS — M545 Low back pain: Secondary | ICD-10-CM | POA: Diagnosis not present

## 2015-10-02 DIAGNOSIS — L821 Other seborrheic keratosis: Secondary | ICD-10-CM | POA: Diagnosis not present

## 2015-10-02 DIAGNOSIS — L918 Other hypertrophic disorders of the skin: Secondary | ICD-10-CM | POA: Diagnosis not present

## 2015-10-30 DIAGNOSIS — Z23 Encounter for immunization: Secondary | ICD-10-CM | POA: Diagnosis not present

## 2015-11-19 ENCOUNTER — Other Ambulatory Visit: Payer: Self-pay | Admitting: Internal Medicine

## 2015-12-26 ENCOUNTER — Ambulatory Visit (INDEPENDENT_AMBULATORY_CARE_PROVIDER_SITE_OTHER): Payer: BLUE CROSS/BLUE SHIELD | Admitting: Physician Assistant

## 2015-12-26 VITALS — BP 148/105 | HR 85 | Temp 98.2°F | Resp 17 | Ht 72.0 in | Wt 220.0 lb

## 2015-12-26 DIAGNOSIS — R05 Cough: Secondary | ICD-10-CM | POA: Diagnosis not present

## 2015-12-26 DIAGNOSIS — R059 Cough, unspecified: Secondary | ICD-10-CM

## 2015-12-26 DIAGNOSIS — B349 Viral infection, unspecified: Secondary | ICD-10-CM

## 2015-12-26 DIAGNOSIS — R0981 Nasal congestion: Secondary | ICD-10-CM | POA: Diagnosis not present

## 2015-12-26 MED ORDER — HYDROCODONE-HOMATROPINE 5-1.5 MG/5ML PO SYRP: 5.0000 mL | ORAL_SOLUTION | Freq: Three times a day (TID) | ORAL | 0 refills | Status: DC | PRN

## 2015-12-26 NOTE — Patient Instructions (Addendum)
Please take Hycodan as prescribed and continue taking Mucinex DM Please stay well hydrated and get plenty of rest.  Please come back in 5-7 days if you are not feeling better.   Thank you for coming in today. I hope you feel we met your needs.  Feel free to call [facility] if you have any questions or further requests.  Please consider signing up for MyChart if you do not already have it, as this is a great way to communicate with me.  Best,  Whitney McVey, PA-C    IF you received an x-ray today, you will receive an invoice from [facility]. Please contact [facility] at [phone] with questions or concerns regarding your invoice.   IF you received labwork today, you will receive an invoice from [employer]. Please contact [facility] at [phone] with questions or concerns regarding your invoice.   Our billing staff will not be able to assist you with questions regarding bills from these companies.  You will be contacted with the lab results as soon as they are available. The fastest way to get your results is to activate your My Chart account. Instructions are located on the last page of this paperwork. If you have not heard from [place] regarding the results in 2 weeks, please contact this office.

## 2015-12-26 NOTE — Progress Notes (Signed)
Marcus FreestoneStephen M Santiago  MRN: [id number] DOB: 06/16/1965  PCP: Willow OraJose Paz, MD  Subjective:  Pt is a 50 year old male PMH migraine, HTN and HLD who presents to clinic for cough. His symptoms stared as a asore throat four days ago. Since that time they have progressed to cough, sneezing, congestion, headache and fatigue. He is having a difficulty time getting to sleep due to nasal drainage and cough. Felt chills yesterday, fever of 99.  Mucinex and Nyquil. Mucinex is helping some. Nyquil is not helping much. Denies chest pain, palpitations, SOB, wheezing, nausea, vomiting, diarrhea, light-headedness. No known sick contacts.  He has had his flu shot this season.   Review of Systems  Constitutional: Positive for fever. Negative for chills and diaphoresis.  HENT: Positive for congestion, sneezing and sore throat.   Respiratory: Positive for cough. Negative for chest tightness, shortness of breath and wheezing.   Cardiovascular: Negative for chest pain and palpitations.  Gastrointestinal: Negative for diarrhea, nausea and vomiting.  Musculoskeletal: Negative for neck pain.  Neurological: Negative for dizziness, syncope, light-headedness and headaches.  Psychiatric/Behavioral: Negative for sleep disturbance. The patient is not nervous/anxious.     Patient Active Problem List   Diagnosis Date Noted  . PCP NOTES >>> 11/07/2014  . Annual physical exam 07/04/2012  . HYPERLIPIDEMIA 10/25/2007  . COMMON MIGRAINE 10/15/2006  . Essential hypertension 08/30/2006    Current Outpatient Prescriptions on File Prior to Visit  Medication Sig Dispense Refill  . aspirin 81 MG tablet Take 81 mg by mouth daily.    . fish oil-omega-3 fatty acids 1000 MG capsule Take 2 g by mouth daily.    . Glucos-Chondroit-Hyaluron-MSM (GLUCOSAMINE CHONDROITIN JOINT PO) Take 1,200 mg by mouth daily at 6 (six) AM.    . ketoprofen (ORUDIS) 75 MG capsule TAKE 1 CAPSULE BY MOUTH TWICE DAILY AS NEEDED 40 capsule 0  . lisinopril  (PRINIVIL,ZESTRIL) 20 MG tablet Take 1 tablet (20 mg total) by mouth daily. 30 tablet 12  . Loratadine (CLARITIN PO) Take 1 tablet by mouth daily as needed.    . meloxicam (MOBIC) 7.5 MG tablet Take 1-2 tablets (7.5-15 mg total) by mouth daily as needed for pain. 60 tablet 0  . metaxalone (SKELAXIN) 800 MG tablet Take 1 tablet (800 mg total) by mouth 3 (three) times daily. 60 tablet 0  . Multiple Vitamin (MULTIVITAMIN) tablet Take 1 tablet by mouth daily.    . naproxen (NAPROSYN) 500 MG tablet Take 1 tablet (500 mg total) by mouth 2 (two) times daily with a meal. 21 tablet 0   No current facility-administered medications on file prior to visit.     No Known Allergies   Objective:  BP (!) 148/105   Pulse 85   Temp 98.2 F (36.8 C) (Oral)   Resp 17   Ht 6' (1.829 m)   Wt 220 lb (99.8 kg)   SpO2 97%   BMI 29.84 kg/m   Physical Exam  Constitutional: He is oriented to person, place, and time and well-developed, well-nourished, and in no distress. No distress.  HENT:  Right Ear: Tympanic membrane normal.  Left Ear: Tympanic membrane normal.  Nose: Mucosal edema present.  Mouth/Throat: Posterior oropharyngeal edema present. No oropharyngeal exudate or posterior oropharyngeal erythema.  Eyes: Conjunctivae are normal.  Neck: Normal range of motion. Neck supple.  Cardiovascular: Normal rate, regular rhythm and normal heart sounds.   Pulmonary/Chest: Effort normal and breath sounds normal. He has no wheezes. He has no rales.  Lymphadenopathy:  He has no cervical adenopathy.  Neurological: He is alert and oriented to person, place, and time. GCS score is 15.  Skin: Skin is warm and dry.  Psychiatric: Mood, memory, affect and judgment normal.  Vitals reviewed.   Assessment and Plan :  1. Viral illness 2. Nasal congestion 3. Cough - HYDROcodone-homatropine (HYCODAN) 5-1.5 MG/5ML syrup; Take 5 mLs by mouth every 8 (eight) hours as needed for cough.  Dispense: 120 mL; Refill: 0 -  Supportive care: please continue Mucinex and drink plenty of fluids. RTC in 5-7 days if you are not better or if your symptoms worsen.   Marco CollieWhitney Lesean Woolverton, PA-C  [facility] 12/26/2015 12:33 PM

## 2016-01-02 ENCOUNTER — Ambulatory Visit (INDEPENDENT_AMBULATORY_CARE_PROVIDER_SITE_OTHER): Payer: BLUE CROSS/BLUE SHIELD | Admitting: Physician Assistant

## 2016-01-02 ENCOUNTER — Other Ambulatory Visit: Payer: Self-pay | Admitting: Internal Medicine

## 2016-01-02 VITALS — BP 140/90 | HR 98 | Temp 99.1°F | Resp 18 | Ht 72.0 in | Wt 225.0 lb

## 2016-01-02 DIAGNOSIS — R0981 Nasal congestion: Secondary | ICD-10-CM

## 2016-01-02 DIAGNOSIS — J029 Acute pharyngitis, unspecified: Secondary | ICD-10-CM

## 2016-01-02 LAB — POCT CBC
Granulocyte percent: 68.6 %G (ref 37–80)
HCT, POC: 44.5 % (ref 43.5–53.7)
Hemoglobin: 15.9 g/dL (ref 14.1–18.1)
Lymph, poc: 2.1 (ref 0.6–3.4)
MCH, POC: 29.5 pg (ref 27–31.2)
MCHC: 35.8 g/dL — AB (ref 31.8–35.4)
MCV: 82.4 fL (ref 80–97)
MID (cbc): 0.4 (ref 0–0.9)
MPV: 6.3 fL (ref 0–99.8)
POC Granulocyte: 5.4 (ref 2–6.9)
POC LYMPH PERCENT: 26.3 %L (ref 10–50)
POC MID %: 5.1 %M (ref 0–12)
Platelet Count, POC: 335 10*3/uL (ref 142–424)
RBC: 5.4 M/uL (ref 4.69–6.13)
RDW, POC: 13.2 %
WBC: 7.8 10*3/uL (ref 4.6–10.2)

## 2016-01-02 MED ORDER — FLUTICASONE PROPIONATE 50 MCG/ACT NA SUSP: 2.0000 | Freq: Every day | NASAL | 12 refills | Status: DC

## 2016-01-02 MED ORDER — DIPHENHYDRAMINE HCL 50 MG PO TABS: 50.0000 mg | ORAL_TABLET | Freq: Every evening | ORAL | 0 refills | Status: DC | PRN

## 2016-01-02 NOTE — Progress Notes (Signed)
Marcus Santiago  MRN: [id number] DOB: 05/19/1965  PCP: Marcus OraJose Paz, MD  Subjective:  Pt is a 50 year old male who presents to clinic for f/u cough and nasal congestion x 10 days.  He was here seven days ago for the same, advised to continue taking Mucinex, Hycodan and rest.  He was feeling "95% better" three days ago, however his symptoms returned the next day with a sore throat, headache, and a cough. His cough is not present all day. Only happens when sore throat is present.  His cough is occasionally productive with yellowish green phlegm. The sore throat is the worse part of his illness, 7/10 pain. Not present during the day, it is the worst at night. Denies pain with swallowing. He states overall he is feeling better, he is concerned however because his symptoms are still present. Denies fever, chills, chest pain, palpitations, weakness, nausea, vomiting.   Has taken Mucinex, provided him with relief. His last dose was two days ago. Has not taken anything in the past two days. He is still using Hycodan, that is helping.    Received flu shot this year.   Review of Systems  Constitutional: Negative for chills and diaphoresis.  HENT: Positive for congestion, postnasal drip, rhinorrhea, sneezing and sore throat.   Respiratory: Negative for cough, chest tightness, shortness of breath and wheezing.   Cardiovascular: Negative for chest pain and palpitations.  Gastrointestinal: Negative for diarrhea, nausea and vomiting.  Musculoskeletal: Negative for neck pain.  Neurological: Negative for dizziness, syncope, light-headedness and headaches.  Psychiatric/Behavioral: Negative for sleep disturbance. The patient is not nervous/anxious.     Patient Active Problem List   Diagnosis Date Noted  . PCP NOTES >>> 11/07/2014  . HYPERLIPIDEMIA 10/25/2007  . COMMON MIGRAINE 10/15/2006  . Essential hypertension 08/30/2006    Current Outpatient Prescriptions on File Prior to Visit  Medication Sig  Dispense Refill  . aspirin 81 MG tablet Take 81 mg by mouth daily.    . fish oil-omega-3 fatty acids 1000 MG capsule Take 2 g by mouth daily.    . Glucos-Chondroit-Hyaluron-MSM (GLUCOSAMINE CHONDROITIN JOINT PO) Take 1,200 mg by mouth daily at 6 (six) AM.    . guaiFENesin (MUCINEX) 600 MG 12 hr tablet Take by mouth 2 (two) times daily.    Marland Kitchen. HYDROcodone-homatropine (HYCODAN) 5-1.5 MG/5ML syrup Take 5 mLs by mouth every 8 (eight) hours as needed for cough. 120 mL 0  . ketoprofen (ORUDIS) 75 MG capsule TAKE 1 CAPSULE BY MOUTH TWICE DAILY AS NEEDED 40 capsule 0  . lisinopril (PRINIVIL,ZESTRIL) 20 MG tablet Take 1 tablet (20 mg total) by mouth daily. 30 tablet 12  . Loratadine (CLARITIN PO) Take 1 tablet by mouth daily as needed.    . meloxicam (MOBIC) 7.5 MG tablet Take 1-2 tablets (7.5-15 mg total) by mouth daily as needed for pain. 60 tablet 0  . metaxalone (SKELAXIN) 800 MG tablet Take 1 tablet (800 mg total) by mouth 3 (three) times daily. 60 tablet 0  . Multiple Vitamin (MULTIVITAMIN) tablet Take 1 tablet by mouth daily.    . naproxen (NAPROSYN) 500 MG tablet Take 1 tablet (500 mg total) by mouth 2 (two) times daily with a meal. 21 tablet 0   No current facility-administered medications on file prior to visit.     No Known Allergies   Objective:  BP 140/90 (BP Location: Right Arm, Patient Position: Sitting, Cuff Size: Small)   Pulse 98   Temp 99.1 F (37.3 C) (  Oral)   Resp 18   Ht 6' (1.829 m)   Wt 225 lb (102.1 kg)   SpO2 97%   BMI 30.52 kg/m   Physical Exam  Constitutional: He is oriented to person, place, and time and well-developed, well-nourished, and in no distress. No distress.  HENT:  Right Ear: Tympanic membrane normal.  Left Ear: Tympanic membrane normal.  Mouth/Throat: Posterior oropharyngeal erythema present. No oropharyngeal exudate or posterior oropharyngeal edema.  Cardiovascular: Normal rate, regular rhythm and normal heart sounds.   Pulmonary/Chest: Effort  normal and breath sounds normal. He has no wheezes. He has no rhonchi. He has no rales.  Neurological: He is alert and oriented to person, place, and time. GCS score is 15.  Skin: Skin is warm and dry.  Psychiatric: Mood, memory, affect and judgment normal.  Vitals reviewed.   Assessment and Plan :  1. Pharyngitis, unspecified etiology 2. Sore throat 3. Nasal congestion - Patient is overall improving. No elevated WBC count, no abnormal findings on physical exam. Suspect viral illness. Treat supportively. RTC in 5-7 days if no improvement.  - POCT CBC - fluticasone (FLONASE) 50 MCG/ACT nasal spray; Place 2 sprays into both nostrils daily.  Dispense: 16 g; Refill: 12 - diphenhydrAMINE (BENADRYL) 50 MG tablet; Take 1 tablet (50 mg total) by mouth at bedtime as needed for itching.  Dispense: 30 tablet; Refill: 0  Marco CollieWhitney Tenecia Ignasiak, PA-C  [facility] 01/02/2016 10:12 AM

## 2016-01-02 NOTE — Patient Instructions (Addendum)
Take Benadryl as directed for the next week - take it at night. Use Flonase: 2 puffs each nostril twice a day. Use warm salt water gargles twice a day until your throat feels better - see below for instructions. Please drink plenty of fluids while you are getting better. Purchase a Neti-pot from your pharmacy.   Thank you for coming in today. I hope you feel we met your needs.  Feel free to call [facility] if you have any questions or further requests.  Please consider signing up for MyChart if you do not already have it, as this is a great way to communicate with me.  Best,  Whitney McVey, PA-C     Pharyngitis Pharyngitis is redness, pain, and swelling (inflammation) of your pharynx. What are the causes? Pharyngitis is usually caused by infection. Most of the time, these infections are from viruses (viral) and are part of a cold. However, sometimes pharyngitis is caused by bacteria (bacterial). Pharyngitis can also be caused by allergies. Viral pharyngitis may be spread from person to person by coughing, sneezing, and personal items or utensils (cups, forks, spoons, toothbrushes). Bacterial pharyngitis may be spread from person to person by more intimate contact, such as kissing. What are the signs or symptoms? Symptoms of pharyngitis include:  Sore throat.  Tiredness (fatigue).  Low-grade fever.  Headache.  Joint pain and muscle aches.  Skin rashes.  Swollen lymph nodes.  Plaque-like film on throat or tonsils (often seen with bacterial pharyngitis). How is this diagnosed? Your health care provider will ask you questions about your illness and your symptoms. Your medical history, along with a physical exam, is often all that is needed to diagnose pharyngitis. Sometimes, a rapid strep test is done. Other lab tests may also be done, depending on the suspected cause. How is this treated? Viral pharyngitis will usually get better in 3-4 days without the use of medicine. Bacterial  pharyngitis is treated with medicines that kill germs (antibiotics). Follow these instructions at home:  Drink enough water and fluids to keep your urine clear or pale yellow.  Only take over-the-counter or prescription medicines as directed by your health care provider:  If you are prescribed antibiotics, make sure you finish them even if you start to feel better.  Do not take aspirin.  Get lots of rest.  Gargle with 8 oz of salt water ( tsp of salt per 1 qt of water) as often as every 1-2 hours to soothe your throat.  Throat lozenges (if you are not at risk for choking) or sprays may be used to soothe your throat. Contact a health care provider if:  You have large, tender lumps in your neck.  You have a rash.  You cough up green, yellow-brown, or bloody spit. Get help right away if:  Your neck becomes stiff.  You drool or are unable to swallow liquids.  You vomit or are unable to keep medicines or liquids down.  You have severe pain that does not go away with the use of recommended medicines.  You have trouble breathing (not caused by a stuffy nose). This information is not intended to replace advice given to you by your health care provider. Make sure you discuss any questions you have with your health care provider. Document Released: 01/05/2005 Document Revised: 06/13/2015 Document Reviewed: 09/12/2012 Elsevier Interactive Patient Education  2017 [employer].   IF you received an x-ray today, you will receive an invoice from [facility]. Please contact [facility] at  [phone] with questions or concerns regarding your invoice.   IF you received labwork today, you will receive an invoice from [employer]. Please contact [facility] at [phone] with questions or concerns regarding your invoice.   Our billing staff will not be able to assist you with questions regarding bills from these companies.  You will be contacted  with the lab results as soon as they are available. The fastest way to get your results is to activate your My Chart account. Instructions are located on the last page of this paperwork. If you have not heard from [place] regarding the results in 2 weeks, please contact this office.

## 2016-06-30 ENCOUNTER — Ambulatory Visit (INDEPENDENT_AMBULATORY_CARE_PROVIDER_SITE_OTHER): Payer: BLUE CROSS/BLUE SHIELD | Admitting: Internal Medicine

## 2016-06-30 ENCOUNTER — Encounter: Payer: Self-pay | Admitting: Internal Medicine

## 2016-06-30 VITALS — BP 126/72 | HR 83 | Temp 98.2°F | Resp 14 | Ht 72.0 in | Wt 225.5 lb

## 2016-06-30 DIAGNOSIS — Z0000 Encounter for general adult medical examination without abnormal findings: Secondary | ICD-10-CM

## 2016-06-30 DIAGNOSIS — Z0001 Encounter for general adult medical examination with abnormal findings: Secondary | ICD-10-CM | POA: Diagnosis not present

## 2016-06-30 DIAGNOSIS — Z23 Encounter for immunization: Secondary | ICD-10-CM

## 2016-06-30 DIAGNOSIS — M545 Low back pain: Secondary | ICD-10-CM | POA: Diagnosis not present

## 2016-06-30 DIAGNOSIS — I10 Essential (primary) hypertension: Secondary | ICD-10-CM | POA: Diagnosis not present

## 2016-06-30 DIAGNOSIS — M79672 Pain in left foot: Secondary | ICD-10-CM

## 2016-06-30 MED ORDER — LISINOPRIL 20 MG PO TABS: 20.0000 mg | ORAL_TABLET | Freq: Every day | ORAL | 3 refills | Status: DC

## 2016-06-30 NOTE — Assessment & Plan Note (Addendum)
--  Td booster today -- + FH prostate ca, DRE 2016 neg, PSA  wnl 2014 and 2016 : reassess next year --CCS: Never had a colonoscopy, no FH, new guidelines  are suggesting to start earlier than 50. Recommend Hemoccult. --Diet and exercise: Improving in the last few months, he works in Development worker, international aidtax preparation and after April he has more time to eat healthy and exercise. -- Labs: CBC, CMP, FLP, TSH

## 2016-06-30 NOTE — Patient Instructions (Addendum)
GO TO  THE FRONT DESK Schedule labs to be done this week, fasting Schedule your next appointment for a  physical exam in one year  Check the  blood pressure 2  times a month   Be sure your blood pressure is between 110/65 and  140/85.  if it is consistently higher or lower, let me know       Back Exercises If you have pain in your back, do these exercises 2-3 times each day or as told by your doctor. When the pain goes away, do the exercises once each day, but repeat the steps more times for each exercise (do more repetitions). If you do not have pain in your back, do these exercises once each day or as told by your doctor. Exercises Single Knee to Chest  Do these steps 3-5 times in a row for each leg: 1. Lie on your back on a firm bed or the floor with your legs stretched out. 2. Bring one knee to your chest. 3. Hold your knee to your chest by grabbing your knee or thigh. 4. Pull on your knee until you feel a gentle stretch in your lower back. 5. Keep doing the stretch for 10-30 seconds. 6. Slowly let go of your leg and straighten it.  Pelvic Tilt  Do these steps 5-10 times in a row: 1. Lie on your back on a firm bed or the floor with your legs stretched out. 2. Bend your knees so they point up to the ceiling. Your feet should be flat on the floor. 3. Tighten your lower belly (abdomen) muscles to press your lower back against the floor. This will make your tailbone point up to the ceiling instead of pointing down to your feet or the floor. 4. Stay in this position for 5-10 seconds while you gently tighten your muscles and breathe evenly.  Cat-Cow  Do these steps until your lower back bends more easily: 1. Get on your hands and knees on a firm surface. Keep your hands under your shoulders, and keep your knees under your hips. You may put padding under your knees. 2. Let your head hang down, and make your tailbone point down to the floor so your lower back is round like the back  of a cat. 3. Stay in this position for 5 seconds. 4. Slowly lift your head and make your tailbone point up to the ceiling so your back hangs low (sags) like the back of a cow. 5. Stay in this position for 5 seconds.  Press-Ups  Do these steps 5-10 times in a row: 1. Lie on your belly (face-down) on the floor. 2. Place your hands near your head, about shoulder-width apart. 3. While you keep your back relaxed and keep your hips on the floor, slowly straighten your arms to raise the top half of your body and lift your shoulders. Do not use your back muscles. To make yourself more comfortable, you may change where you place your hands. 4. Stay in this position for 5 seconds. 5. Slowly return to lying flat on the floor.  Bridges  Do these steps 10 times in a row: 1. Lie on your back on a firm surface. 2. Bend your knees so they point up to the ceiling. Your feet should be flat on the floor. 3. Tighten your butt muscles and lift your butt off of the floor until your waist is almost as high as your knees. If you do not feel the muscles working  in your butt and the back of your thighs, slide your feet 1-2 inches farther away from your butt. 4. Stay in this position for 3-5 seconds. 5. Slowly lower your butt to the floor, and let your butt muscles relax.  If this exercise is too easy, try doing it with your arms crossed over your chest. Belly Crunches  Do these steps 5-10 times in a row: 1. Lie on your back on a firm bed or the floor with your legs stretched out. 2. Bend your knees so they point up to the ceiling. Your feet should be flat on the floor. 3. Cross your arms over your chest. 4. Tip your chin a little bit toward your chest but do not bend your neck. 5. Tighten your belly muscles and slowly raise your chest just enough to lift your shoulder blades a tiny bit off of the floor. 6. Slowly lower your chest and your head to the floor.  Back Lifts Do these steps 5-10 times in a  row: 1. Lie on your belly (face-down) with your arms at your sides, and rest your forehead on the floor. 2. Tighten the muscles in your legs and your butt. 3. Slowly lift your chest off of the floor while you keep your hips on the floor. Keep the back of your head in line with the curve in your back. Look at the floor while you do this. 4. Stay in this position for 3-5 seconds. 5. Slowly lower your chest and your face to the floor.  Contact a doctor if:  Your back pain gets a lot worse when you do an exercise.  Your back pain does not lessen 2 hours after you exercise. If you have any of these problems, stop doing the exercises. Do not do them again unless your doctor says it is okay. Get help right away if:  You have sudden, very bad back pain. If this happens, stop doing the exercises. Do not do them again unless your doctor says it is okay. This information is not intended to replace advice given to you by your health care provider. Make sure you discuss any questions you have with your health care provider. Document Released: 02/07/2010 Document Revised: 06/13/2015 Document Reviewed: 03/01/2014 Elsevier Interactive Patient Education  2018 [employer]. [place] weekly daily Be sure your blood pressure is between 110/65 and  145/85. If it is consistently higher or lower, let me know

## 2016-06-30 NOTE — Progress Notes (Signed)
Subjective:    Patient ID: Marcus FreestoneStephen M Botello, male    DOB: 06/30/1965, 51 y.o.   MRN: [id number]  DOS:  06/30/2016 Type of visit - description : cpx Interval history: Has few concerns, see below   Review of Systems Back pain, on and off, has seen the urgent care doctor and orthopedics. X-ray was okay, was recommend stretching and a stand up desk. Having pain on and off and the left foot, last few seconds, "superficial, like is on the skin".  Other than above, a 14 point review of systems is negative     Past Medical History:  Diagnosis Date  . Allergy   . Chest pain    (-) stress test 2008, 2010  . Headache   . Hyperlipemia   . Hypertension     Past Surgical History:  Procedure Laterality Date  . KNEE ARTHROSCOPY     L x 2    Social History   Social History  . Marital status: Married    Spouse name: N/A  . Number of children: 0  . Years of education: N/A   Occupational History  . tax preparation     Social History Main Topics  . Smoking status: Never Smoker  . Smokeless tobacco: Never Used  . Alcohol use No     Comment: rare  . Drug use: Unknown  . Sexual activity: Not on file   Other Topics Concern  . Not on file   Social History Narrative  . No narrative on file     Family History  Problem Relation Age of Onset  . Prostate cancer Other        uncle  . CAD Father        F first MI ~ 5850, GF, uncle   . Heart disease Father   . Hyperlipidemia Father   . Hypertension Father   . Stroke Other        GF  . Lupus Mother   . Diabetes Paternal Grandmother   . Heart disease Paternal Grandfather   . Stroke Paternal Grandfather   . Colon cancer Neg Hx      Allergies as of 06/30/2016   No Known Allergies     Medication List       Accurate as of 06/30/16 11:59 PM. Always use your most recent med list.          aspirin 81 MG tablet Take 81 mg by mouth daily.   fish oil-omega-3 fatty acids 1000 MG capsule Take 2 g by mouth daily.     GLUCOSAMINE CHONDROITIN JOINT PO Take 1,200 mg by mouth daily at 6 (six) AM.   lisinopril 20 MG tablet Commonly known as:  PRINIVIL,ZESTRIL Take 1 tablet (20 mg total) by mouth daily.          Objective:   Physical Exam BP 126/72 (BP Location: Left Arm, Patient Position: Sitting, Cuff Size: Normal)   Pulse 83   Temp 98.2 F (36.8 C) (Oral)   Resp 14   Ht 6' (1.829 m)   Wt 225 lb 8 oz (102.3 kg)   SpO2 98%   BMI 30.58 kg/m   General:   Well developed, well nourished . NAD.  Neck: No  thyromegaly  HEENT:  Normocephalic . Face symmetric, atraumatic Lungs:  CTA B Normal respiratory effort, no intercostal retractions, no accessory muscle use. Heart: RRR,  no murmur.  No pretibial edema bilaterally  Abdomen:  Not distended, soft, non-tender. No rebound or rigidity.  MSK: Back no TTP Feet and ankles: Symmetric, normal to inspection, palpation, normal ROM.  Skin: Exposed areas without rash. Not pale. Not jaundice Neurologic:  alert & oriented X3.  Speech normal, gait appropriate for age and unassisted Strength symmetric and appropriate for age.  Psych: Cognition and judgment appear intact.  Cooperative with normal attention span and concentration.  Behavior appropriate. No anxious or depressed appearing.    Assessment & Plan:   Assessment> HTN Hyperlipidemia Chest pain : (-) stress test 2008, 2010  PLAN   HTN: Seems controlled, continue lisinopril. Back pain: Encourage stretching and appropriate posture. Left foot pain: Unclear etiology, symptoms are  mild, declined a referral to a podiatrist for sports medicine doctor but will call if he changes his mind. RTC one year.

## 2016-06-30 NOTE — Progress Notes (Signed)
Pre visit review using our clinic review tool, if applicable. No additional management support is needed unless otherwise documented below in the visit note. 

## 2016-07-01 NOTE — Assessment & Plan Note (Signed)
HTN: Seems controlled, continue lisinopril. Back pain: Encourage stretching and appropriate posture. Left foot pain: Unclear etiology, symptoms are  mild, declined a referral to a podiatrist for sports medicine doctor but will call if he changes his mind. RTC one year.

## 2016-07-14 DIAGNOSIS — H5203 Hypermetropia, bilateral: Secondary | ICD-10-CM | POA: Diagnosis not present

## 2016-07-21 ENCOUNTER — Encounter: Payer: Self-pay | Admitting: Physician Assistant

## 2016-07-21 ENCOUNTER — Ambulatory Visit (INDEPENDENT_AMBULATORY_CARE_PROVIDER_SITE_OTHER): Payer: BLUE CROSS/BLUE SHIELD | Admitting: Physician Assistant

## 2016-07-21 ENCOUNTER — Ambulatory Visit (INDEPENDENT_AMBULATORY_CARE_PROVIDER_SITE_OTHER): Payer: BLUE CROSS/BLUE SHIELD

## 2016-07-21 VITALS — BP 136/87 | HR 71 | Temp 98.2°F | Resp 18 | Ht 70.87 in | Wt 228.2 lb

## 2016-07-21 DIAGNOSIS — R229 Localized swelling, mass and lump, unspecified: Secondary | ICD-10-CM | POA: Diagnosis not present

## 2016-07-21 DIAGNOSIS — R2231 Localized swelling, mass and lump, right upper limb: Secondary | ICD-10-CM | POA: Diagnosis not present

## 2016-07-21 NOTE — Progress Notes (Signed)
07/21/2016 12:40 PM   DOB: 03/23/1965 / MRN: [id number]  SUBJECTIVE:  Marcus Santiago is a 51 y.o. male presenting for a lump on the right arm that has been there a week but he is not sure how long it has been there.  There is no pain, bruising or trauma.  It is not changing. No motor changes about the hand or wrist.   He has No Known Allergies.   He  has a past medical history of Allergy; Chest pain; Headache; Hyperlipemia; and Hypertension.    He  reports that he has never smoked. He has never used smokeless tobacco. He reports that he drinks alcohol. He reports that he does not use drugs. He  has no sexual activity history on file. The patient  has a past surgical history that includes Knee arthroscopy.  His family history includes CAD in his father; Diabetes in his paternal grandmother; Heart disease in his father and paternal grandfather; Hyperlipidemia in his father; Hypertension in his father; Lupus in his mother; Prostate cancer in his other; Stroke in his other and paternal grandfather.  Review of Systems  Constitutional: Negative for chills, diaphoresis and fever.  Gastrointestinal: Negative for nausea.  Skin: Negative for rash.  Neurological: Negative for dizziness.    The problem list and medications were reviewed and updated by myself where necessary and exist elsewhere in the encounter.   OBJECTIVE:  BP 136/87 (BP Location: Right Arm, Patient Position: Sitting, Cuff Size: Large)   Pulse 71   Temp 98.2 F (36.8 C) (Oral)   Resp 18   Ht 5' 10.87" (1.8 m)   Wt 228 lb 3.2 oz (103.5 kg)   SpO2 98%   BMI 31.95 kg/m   Physical Exam  Constitutional: He is oriented to person, place, and time. He appears well-developed. He is active and cooperative.  Non-toxic appearance.  Cardiovascular: Normal rate and regular rhythm.   Pulmonary/Chest: Effort normal and breath sounds normal. No tachypnea.  Musculoskeletal: Normal range of motion.       Arms: Neurological: He is  alert and oriented to person, place, and time. He displays normal reflexes. No cranial nerve deficit. He exhibits normal muscle tone. Coordination normal.  Skin: Skin is warm and dry. He is not diaphoretic. No pallor.  Vitals reviewed.   No results found for this or any previous visit (from the past 72 hour(s)).  Dg Forearm Right  Result Date: 07/21/2016 CLINICAL DATA:  He medial forearm mass. EXAM: RIGHT FOREARM - 2 VIEW COMPARISON:  None. FINDINGS: There is no evidence of fracture or other focal bone lesions. Soft tissues are unremarkable. No v. No radiopaque foreign body. Isible soft tissue mass IMPRESSION: Negative. Electronically Signed   By: Charlett NoseKevin  Dover M.D.   On: 07/21/2016 12:33    ASSESSMENT AND PLAN:  Marcus Santiago was seen today for mass.  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         -     DG Forearm Right; Future -     US RT UPPER EXTREM LTD SOFT TISSUE NON VASCULAR; Future    The patient is advised to call or return to clinic if he does not see an improvement in symptoms, or to seek the care of the closest emergency department if he worsens with the above plan.   Deliah BostonMichael Clark,  MHS, PA-C [facility] 07/21/2016 12:40 PM

## 2016-07-21 NOTE — Patient Instructions (Signed)
     IF you received an x-ray today, you will receive an invoice from [facility]. Please contact [facility] at [phone] with questions or concerns regarding your invoice.   IF you received labwork today, you will receive an invoice from [facility]. Please contact [facility] at [phone] with questions or concerns regarding your invoice.   Our billing staff will not be able to assist you with questions regarding bills from these companies.  You will be contacted with the lab results as soon as they are available. The fastest way to get your results is to activate your My Chart account. Instructions are located on the last page of this paperwork. If you have not heard from us regarding the results in 2 weeks, please contact this office.     

## 2016-08-07 ENCOUNTER — Ambulatory Visit
Admission: RE | Admit: 2016-08-07 | Discharge: 2016-08-07 | Disposition: A | Discharge status: Home / Self Care | Payer: BLUE CROSS/BLUE SHIELD | Source: Ambulatory Visit | Attending: Physician Assistant | Admitting: Physician Assistant

## 2016-08-07 DIAGNOSIS — R2231 Localized swelling, mass and lump, right upper limb: Secondary | ICD-10-CM | POA: Diagnosis not present

## 2016-08-07 DIAGNOSIS — R229 Localized swelling, mass and lump, unspecified: Secondary | ICD-10-CM

## 2016-10-18 ENCOUNTER — Other Ambulatory Visit: Payer: Self-pay | Admitting: Internal Medicine

## 2016-10-19 NOTE — Telephone Encounter (Signed)
Okay ketoprofen 75 mg one tablet daily when necessary #30 and one refills. Take with food.

## 2016-10-19 NOTE — Telephone Encounter (Signed)
Rx sent 

## 2016-10-19 NOTE — Telephone Encounter (Signed)
Pt requesting refill on Ketoprofen . No longer on med list. Please advise.

## 2016-12-22 ENCOUNTER — Ambulatory Visit: Payer: BLUE CROSS/BLUE SHIELD | Admitting: Physician Assistant

## 2016-12-22 ENCOUNTER — Encounter: Payer: Self-pay | Admitting: Physician Assistant

## 2016-12-22 VITALS — BP 122/72 | HR 87 | Temp 98.1°F | Resp 17 | Ht 71.5 in | Wt 227.0 lb

## 2016-12-22 DIAGNOSIS — J069 Acute upper respiratory infection, unspecified: Secondary | ICD-10-CM | POA: Diagnosis not present

## 2016-12-22 DIAGNOSIS — R059 Cough, unspecified: Secondary | ICD-10-CM

## 2016-12-22 DIAGNOSIS — R0981 Nasal congestion: Secondary | ICD-10-CM

## 2016-12-22 DIAGNOSIS — R05 Cough: Secondary | ICD-10-CM

## 2016-12-22 MED ORDER — IPRATROPIUM BROMIDE 0.03 % NA SOLN: 2.0000 | Freq: Two times a day (BID) | NASAL | 0 refills | Status: DC

## 2016-12-22 MED ORDER — HYDROCODONE-HOMATROPINE 5-1.5 MG/5ML PO SYRP: 5.0000 mL | ORAL_SOLUTION | Freq: Three times a day (TID) | ORAL | 0 refills | Status: DC | PRN

## 2016-12-22 NOTE — Progress Notes (Signed)
   Marcus FreestoneStephen M Santiago  MRN: [id number] DOB: 10/03/1965  PCP: Wanda PlumpPaz, Jose E, MD  Subjective:  Pt is a 51 year old male PMH HTN, HLD, migraine who presents to clinic for sore throat, nasal congestion and cough.  NyQuil, and Mucinex DM.   Review of Systems  Constitutional: Negative for chills, diaphoresis, fatigue and fever.  HENT: Positive for congestion, postnasal drip, rhinorrhea and sore throat. Negative for sinus pressure and sinus pain.   Respiratory: Positive for cough. Negative for shortness of breath.   Cardiovascular: Negative for chest pain and palpitations.  Gastrointestinal: Negative for nausea and vomiting.  Psychiatric/Behavioral: Negative for sleep disturbance.    Patient Active Problem List   Diagnosis Date Noted  . PCP NOTES >>> 11/07/2014  . Annual physical exam 07/04/2012  . HYPERLIPIDEMIA 10/25/2007  . COMMON MIGRAINE 10/15/2006  . Essential hypertension 08/30/2006    Current Outpatient Medications on File Prior to Visit  Medication Sig Dispense Refill  . aspirin 81 MG tablet Take 81 mg by mouth daily.    . fish oil-omega-3 fatty acids 1000 MG capsule Take 2 g by mouth daily.    . Glucos-Chondroit-Hyaluron-MSM (GLUCOSAMINE CHONDROITIN JOINT PO) Take 1,200 mg by mouth daily at 6 (six) AM.    . ketoprofen (ORUDIS) 75 MG capsule Take 1 capsule (75 mg total) by mouth daily as needed for moderate pain. Always take w/ food 30 capsule 1  . lisinopril (PRINIVIL,ZESTRIL) 20 MG tablet Take 1 tablet (20 mg total) by mouth daily. 90 tablet 3   No current facility-administered medications on file prior to visit.     No Known Allergies   Objective:  BP 122/72   Pulse 87   Temp 98.1 F (36.7 C) (Oral)   Resp 17   Ht 5' 11.5" (1.816 m)   Wt 227 lb (103 kg)   SpO2 98%   BMI 31.22 kg/m   Physical Exam  Constitutional: He is oriented to person, place, and time and well-developed, well-nourished, and in no distress. No distress.  HENT:  Right Ear: Tympanic membrane  normal.  Left Ear: Tympanic membrane normal.  Nose: Right sinus exhibits no maxillary sinus tenderness and no frontal sinus tenderness. Left sinus exhibits no maxillary sinus tenderness and no frontal sinus tenderness.  Mouth/Throat: Oropharynx is clear and moist and mucous membranes are normal.  Cardiovascular: Normal rate, regular rhythm and normal heart sounds.  Lymphadenopathy:    He has no cervical adenopathy.  Neurological: He is alert and oriented to person, place, and time. GCS score is 15.  Skin: Skin is warm and dry.  Psychiatric: Mood, memory, affect and judgment normal.  Vitals reviewed.   Assessment and Plan :  1. Acute upper respiratory infection 2. Nasal congestion - ipratropium (ATROVENT) 0.03 % nasal spray; Place 2 sprays into both nostrils 2 (two) times daily.  Dispense: 30 mL; Refill: 0 - Pt presents for nasal congestion and sore throat x 2 days. Vitals are stable, PE unremarkable. Suspect viral etiology. Plan to treat supportively, stay well hydrated. RTC in 5-7 days if no improvement.  3. Cough - HYDROcodone-homatropine (HYCODAN) 5-1.5 MG/5ML syrup; Take 5 mLs by mouth every 8 (eight) hours as needed for cough.  Dispense: 120 mL; Refill: 0   Whitney Ragen Laver, PA-C  [facility] 12/22/2016 10:59 AM

## 2016-12-22 NOTE — Patient Instructions (Addendum)
Start taking over the counter zinc or zicam.  Stay well hydrated. Get lost of rest. Come back if you are not better in 7 days.   Advil or ibuprofen for pain. Do not take Aspirin.  Throat lozenges (if you are not at risk for choking) or sprays may be used to soothe your throat. Drink enough water and fluids to keep your urine clear or pale yellow. For sore throat: ? Gargle with 8 oz of salt water ( tsp of salt per 1 qt of water) as often as every 1-2 hours to soothe your throat.  Gargle liquid benadryl.  Use Elderberry syrup.   For sore throat try using a honey-based tea. Use 3 teaspoons of honey with juice squeezed from half lemon. Place shaved pieces of ginger into 1/2-1 cup of water and warm over stove top. Then mix the ingredients and repeat every 4 hours as needed.  Cough Syrup Recipe: Sweet Lemon & Honey Thyme  Ingredients a handful of fresh thyme sprigs   1 pint of water (2 cups)  1/2 cup honey (raw is best, but regular will do)  1/2 lemon chopped Instructions 1. Place the lemon in the pint jar and cover with the honey. The honey will macerate the lemons and draw out liquids which taste so delicious! 2. Meanwhile, toss the thyme leaves into a saucepan and cover them with the water. 3. Bring the water to a gentle simmer and reduce it to half, about a cup of tea. 4. When the tea is reduced and cooled a bit, strain the sprigs & leaves, add it into the pint jar and stir it well. 5. Give it a shake and use a spoonful as needed. 6. Store your homemade cough syrup in the refrigerator for about a month.  What causes a cough? In adults, common causes of a cough include: ?An infection of the airways or lungs (such as the common cold) ?Postnasal drip - Postnasal drip is when mucus from the nose drips down or flows along the back of the throat. Postnasal drip can happen when people have: .A cold .Allergies .A sinus infection - The sinuses are hollow areas in the bones of the face that  open into the nose. ?Lung conditions, like asthma and chronic obstructive pulmonary disease (COPD) - Both of these conditions can make it hard to breathe. COPD is usually caused by smoking. ?Acid reflux - Acid reflux is when the acid that is normally in your stomach backs up into your esophagus (the tube that carries food from your mouth to your stomach). ?A side effect from blood pressure medicines called "ACE inhibitors" ?Smoking cigarettes  Is there anything I can do on my own to get rid of my cough? Yes. To help get rid of your cough, you can: ?Use a humidifier in your bedroom ?Use an over-the-counter cough medicine, or suck on cough drops or hard candy ?Stop smoking, if you smoke ?If you have allergies, avoid the things you are allergic to (like pollen, dust, animals, or mold) If you have acid reflux, your doctor or nurse will tell you which lifestyle changes can help reduce symptoms.    IF you received an x-ray today, you will receive an invoice from [facility]. Please contact [facility] at [phone] with questions or concerns regarding your invoice.   IF you received labwork today, you will receive an invoice from [facility]. Please contact [facility] at [phone] with questions or concerns regarding your invoice.   Our billing staff will  not be able to assist you with questions regarding bills from these companies.  You will be contacted with the lab results as soon as they are available. The fastest way to get your results is to activate your My Chart account. Instructions are located on the last page of this paperwork. If you have not heard from [place] regarding the results in 2 weeks, please contact this office.

## 2016-12-26 IMAGING — US US ABDOMEN COMPLETE
1 series · 13 of 25 positions shown · non-contrast
Comparison: CT abdomen pelvis - 03/04/2005

CLINICAL DATA: Sporadic right upper quadrant abdominal pain for the
past 2-3 months. History of hypertension and hyperlipidemia.

EXAM:
ULTRASOUND ABDOMEN COMPLETE

[Series 1: us abdomen complete · 0.17mm/px · 13 of 74 slices shown]
[im 1/74]
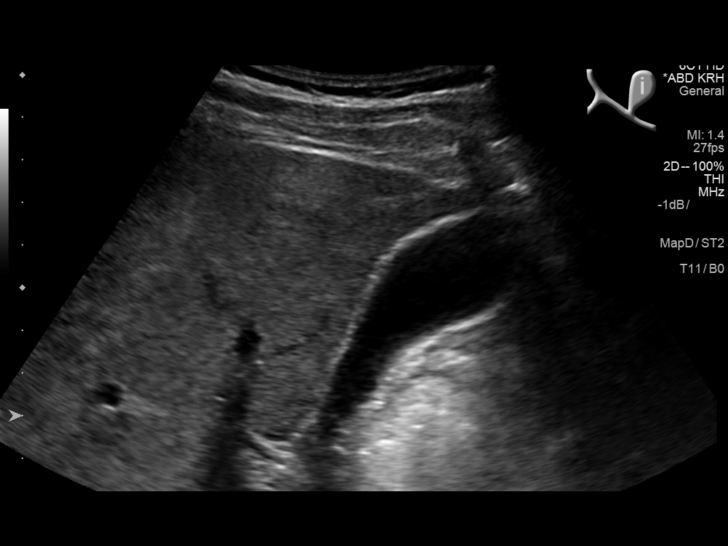
[im 7/74]
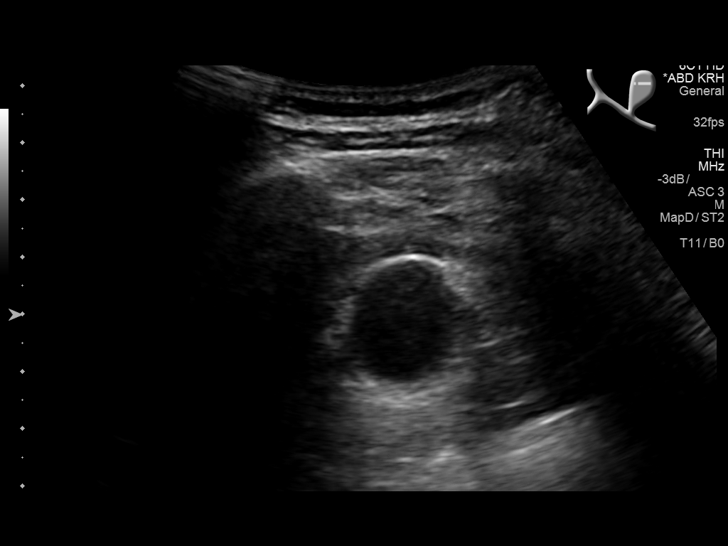
[im 13/74]
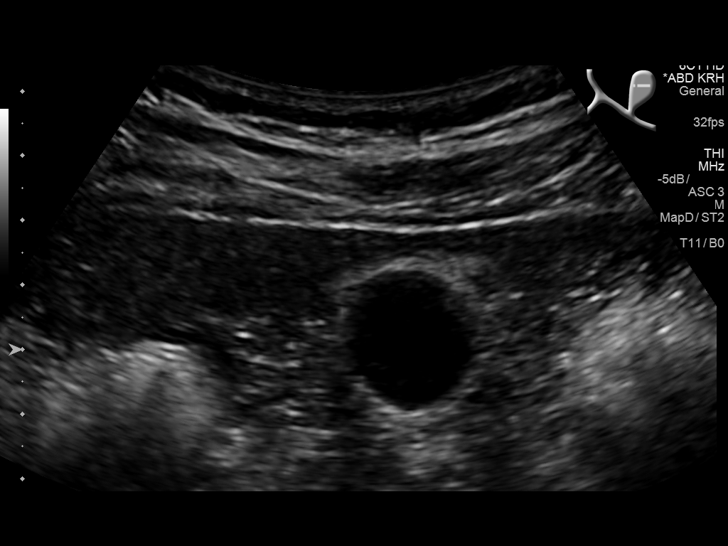
[im 19/74]
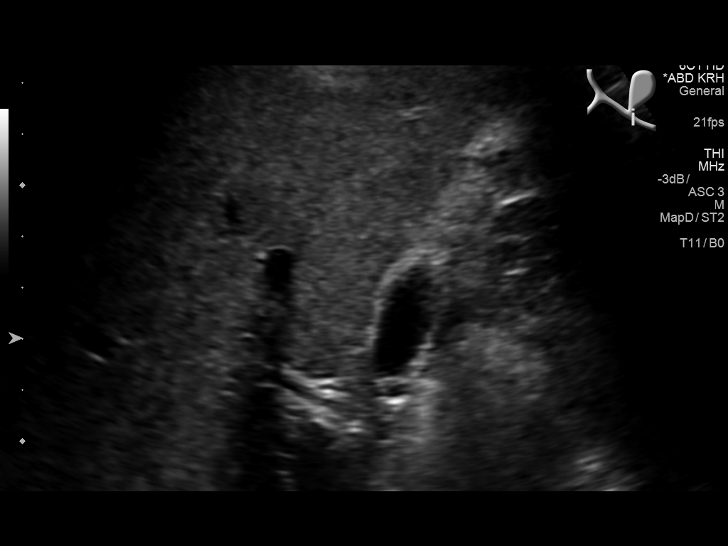
[im 25/74]
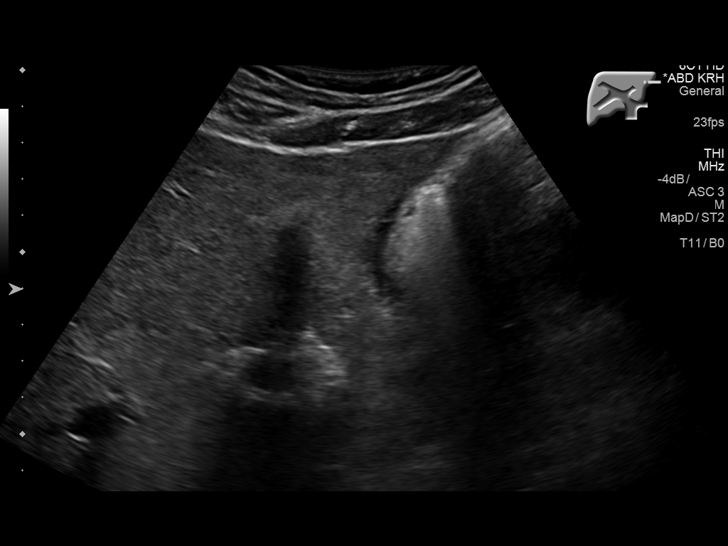
[im 31/74]
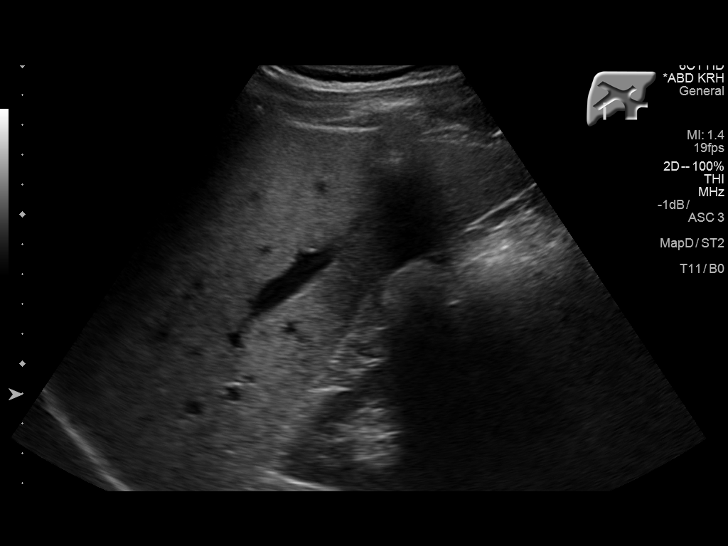
[im 37/74]
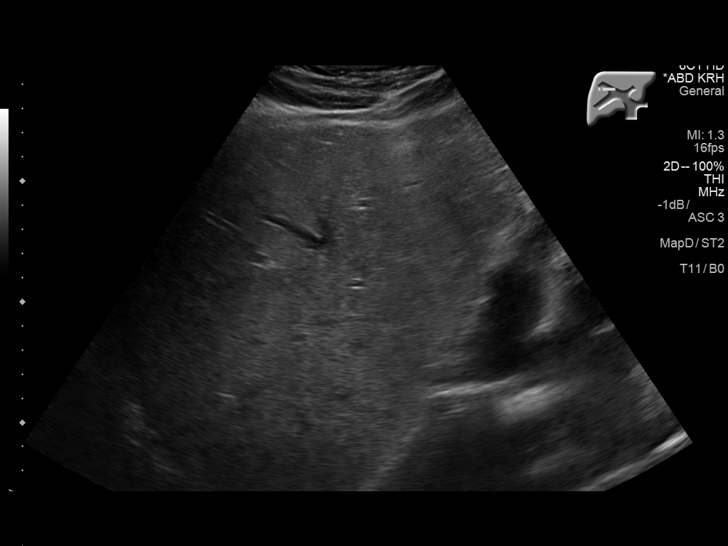
[im 43/74]
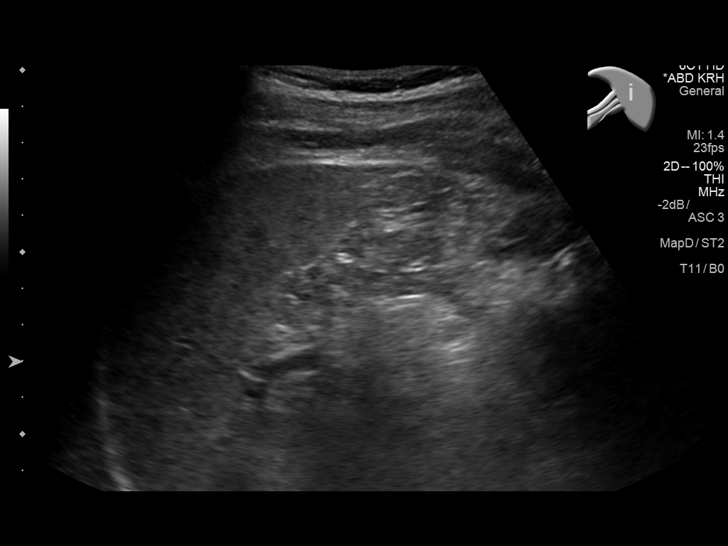
[im 49/74]
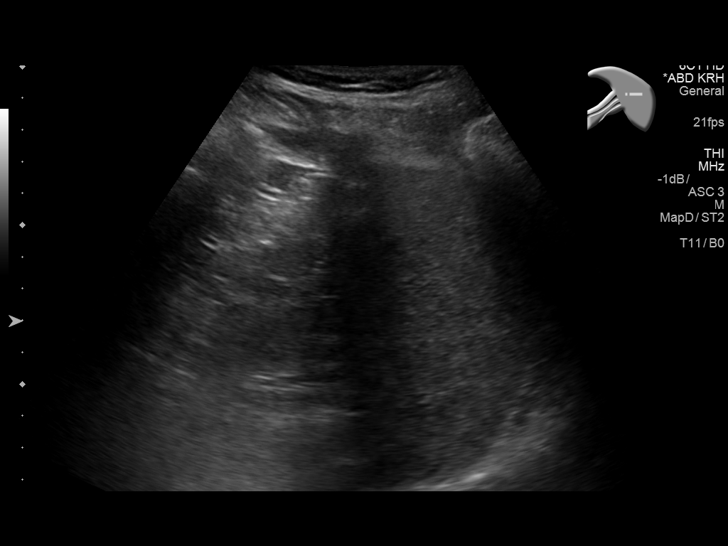
[im 55/74]
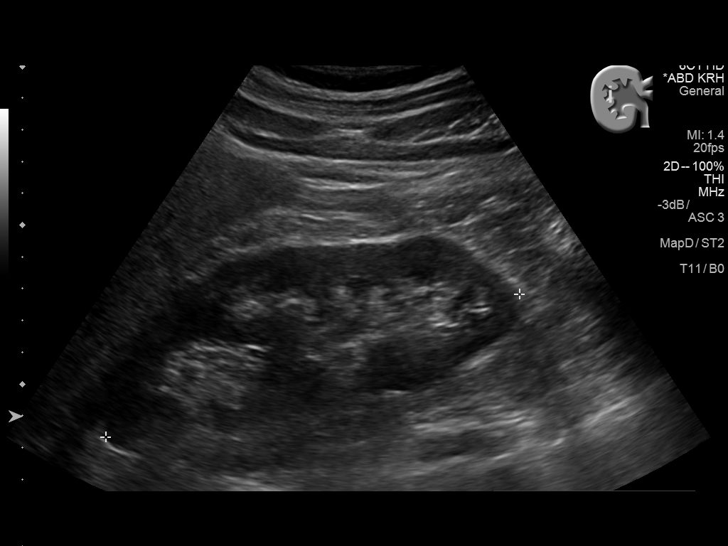
[im 61/74]
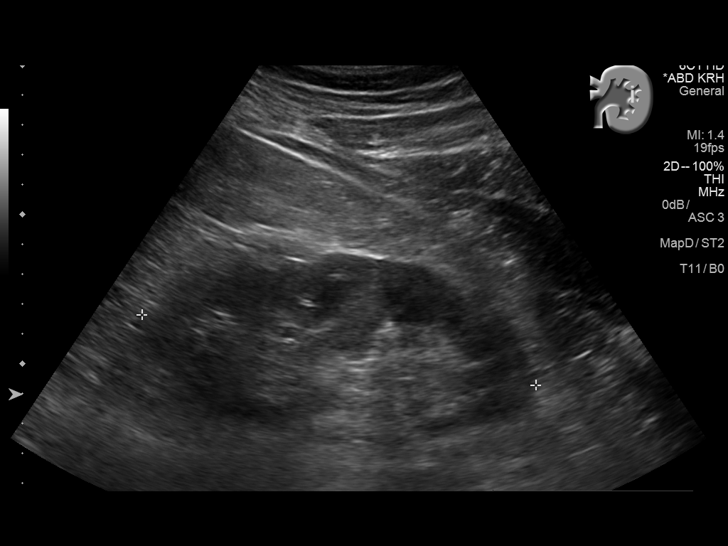
[im 67/74]
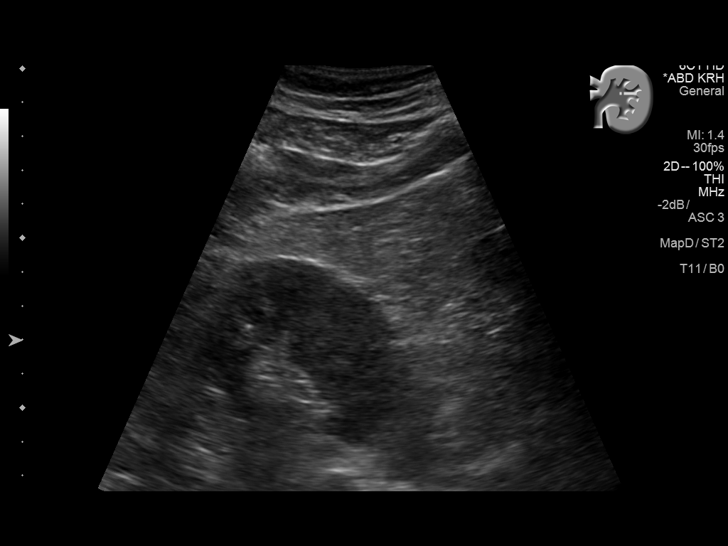
[im 74/74]
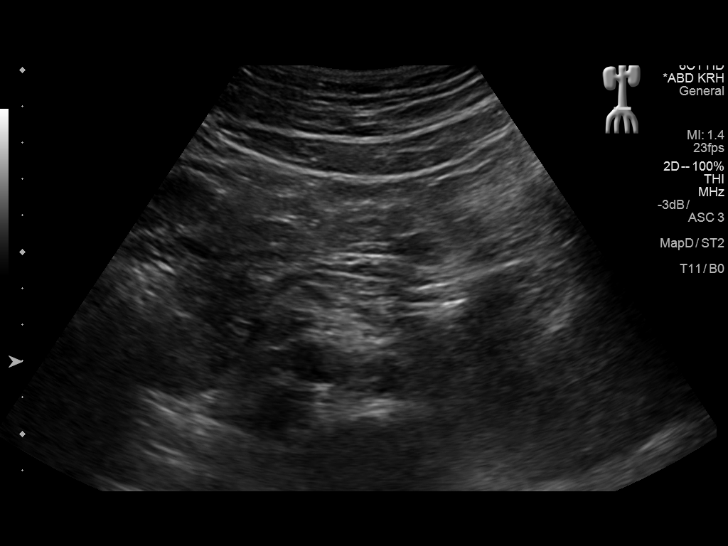

[13 of 25 positions shown; findings below may reference images not displayed]

FINDINGS: Gallbladder: Sonographically normal. No echogenic gallstones or gall
sludge. No gallbladder wall thickening or pericholecystic fluid.
Negative sonographic Murphy's sign.

Common bile duct: Normal in size measuring 2 mm in diameter

Liver: There is mild diffuse increased slightly coarsened
echogenicity of the hepatic parenchyma (representative image 28). No
discrete hepatic lesions. No intrahepatic biliary duct dilatation.
No ascites.

IVC: No abnormality visualized.

Pancreas: Limited visualization of the pancreatic head and neck is
normal. Visualization of the pancreatic body and tail is obscured by
bowel gas.

Spleen: Normal in size measuring 7 mm in length.

Right Kidney: Normal cortical thickness, echogenicity and size,
measuring 13.8 cm in length. No focal renal lesions. No echogenic
renal stones. No urinary obstruction.

Left Kidney: Normal cortical thickness, echogenicity and size,
measuring 13.4 cm in length. No focal renal lesions. No echogenic
renal stones. No urinary obstruction.

Abdominal aorta: No aneurysm visualized.

Other findings: None.
IMPRESSION: 1. No explanation for patient's intermittent right upper quadrant
abdominal pain. Specifically, no evidence of cholelithiasis.
2. Suspected mild hepatic steatosis. Correlation with LFTs is
recommended.

## 2017-01-28 ENCOUNTER — Other Ambulatory Visit (INDEPENDENT_AMBULATORY_CARE_PROVIDER_SITE_OTHER): Payer: BLUE CROSS/BLUE SHIELD

## 2017-01-28 DIAGNOSIS — Z0000 Encounter for general adult medical examination without abnormal findings: Secondary | ICD-10-CM | POA: Diagnosis not present

## 2017-01-28 LAB — LIPID PANEL
Cholesterol: 150 mg/dL (ref 0–200)
HDL: 37.6 mg/dL — ABNORMAL LOW (ref >39.00)
NonHDL: 112.82
Total CHOL/HDL Ratio: 4
Triglycerides: 229 mg/dL — ABNORMAL HIGH (ref 0.0–149.0)
VLDL: 45.8 mg/dL — ABNORMAL HIGH (ref 0.0–40.0)

## 2017-01-28 LAB — LDL CHOLESTEROL, DIRECT: Direct LDL: 62 mg/dL

## 2017-01-28 LAB — COMPREHENSIVE METABOLIC PANEL
ALT: 22 U/L (ref 0–53)
AST: 20 U/L (ref 0–37)
Albumin: 4.5 g/dL (ref 3.5–5.2)
Alkaline Phosphatase: 49 U/L (ref 39–117)
BUN: 24 mg/dL — ABNORMAL HIGH (ref 6–23)
CO2: 27 mEq/L (ref 19–32)
Calcium: 9 mg/dL (ref 8.4–10.5)
Chloride: 106 mEq/L (ref 96–112)
Creatinine, Ser: 1.07 mg/dL (ref 0.40–1.50)
GFR: 94.43 mL/min (ref >60.00)
Glucose, Bld: 108 mg/dL — ABNORMAL HIGH (ref 70–99)
Potassium: 3.9 mEq/L (ref 3.5–5.1)
Sodium: 140 mEq/L (ref 135–145)
Total Bilirubin: 0.8 mg/dL (ref 0.2–1.2)
Total Protein: 7.7 g/dL (ref 6.0–8.3)

## 2017-01-28 LAB — CBC WITH DIFFERENTIAL/PLATELET
Basophils Absolute: 0 10*3/uL (ref 0.0–0.1)
Basophils Relative: 0.2 % (ref 0.0–3.0)
Eosinophils Absolute: 0.1 10*3/uL (ref 0.0–0.7)
Eosinophils Relative: 1.2 % (ref 0.0–5.0)
HCT: 46.1 % (ref 39.0–52.0)
Hemoglobin: 15.5 g/dL (ref 13.0–17.0)
Lymphocytes Relative: 30.3 % (ref 12.0–46.0)
Lymphs Abs: 1.9 10*3/uL (ref 0.7–4.0)
MCHC: 33.6 g/dL (ref 30.0–36.0)
MCV: 85 fl (ref 78.0–100.0)
Monocytes Absolute: 0.4 10*3/uL (ref 0.1–1.0)
Monocytes Relative: 6.8 % (ref 3.0–12.0)
Neutro Abs: 3.9 10*3/uL (ref 1.4–7.7)
Neutrophils Relative %: 61.5 % (ref 43.0–77.0)
Platelets: 274 10*3/uL (ref 150.0–400.0)
RBC: 5.42 Mil/uL (ref 4.22–5.81)
RDW: 14.1 % (ref 11.5–15.5)
WBC: 6.3 10*3/uL (ref 4.0–10.5)

## 2017-01-28 LAB — TSH: TSH: 2.28 u[IU]/mL (ref 0.35–4.50)

## 2017-02-01 ENCOUNTER — Encounter: Payer: Self-pay | Admitting: Internal Medicine

## 2017-04-10 DIAGNOSIS — R197 Diarrhea, unspecified: Secondary | ICD-10-CM | POA: Diagnosis not present

## 2017-06-12 ENCOUNTER — Encounter (HOSPITAL_COMMUNITY): Payer: Self-pay

## 2017-06-12 ENCOUNTER — Other Ambulatory Visit: Payer: Self-pay

## 2017-06-12 ENCOUNTER — Ambulatory Visit (HOSPITAL_COMMUNITY)
Admission: EM | Admit: 2017-06-12 | Discharge: 2017-06-12 | Disposition: A | Discharge status: Home / Self Care | Payer: BLUE CROSS/BLUE SHIELD | Attending: Family Medicine | Admitting: Family Medicine

## 2017-06-12 DIAGNOSIS — J029 Acute pharyngitis, unspecified: Secondary | ICD-10-CM | POA: Insufficient documentation

## 2017-06-12 DIAGNOSIS — G43009 Migraine without aura, not intractable, without status migrainosus: Secondary | ICD-10-CM | POA: Diagnosis not present

## 2017-06-12 DIAGNOSIS — Z8249 Family history of ischemic heart disease and other diseases of the circulatory system: Secondary | ICD-10-CM | POA: Insufficient documentation

## 2017-06-12 DIAGNOSIS — Z79899 Other long term (current) drug therapy: Secondary | ICD-10-CM | POA: Diagnosis not present

## 2017-06-12 DIAGNOSIS — Z7982 Long term (current) use of aspirin: Secondary | ICD-10-CM | POA: Insufficient documentation

## 2017-06-12 DIAGNOSIS — I10 Essential (primary) hypertension: Secondary | ICD-10-CM | POA: Insufficient documentation

## 2017-06-12 DIAGNOSIS — Z8269 Family history of other diseases of the musculoskeletal system and connective tissue: Secondary | ICD-10-CM | POA: Insufficient documentation

## 2017-06-12 DIAGNOSIS — E785 Hyperlipidemia, unspecified: Secondary | ICD-10-CM | POA: Diagnosis not present

## 2017-06-12 LAB — POCT RAPID STREP A: Streptococcus, Group A Screen (Direct): NEGATIVE

## 2017-06-12 MED ORDER — ONDANSETRON 8 MG PO TBDP: 8.0000 mg | ORAL_TABLET | Freq: Three times a day (TID) | ORAL | 0 refills | Status: DC | PRN

## 2017-06-12 MED ORDER — CHLORHEXIDINE GLUCONATE 0.12 % MT SOLN: 15.0000 mL | Freq: Two times a day (BID) | OROMUCOSAL | 0 refills | Status: DC

## 2017-06-12 NOTE — ED Provider Notes (Addendum)
[facility]   [id number] 06/12/17 Arrival Time: 1259   SUBJECTIVE:  NESTOR WIENEKE is a 52 y.o. male who presents to the urgent care with complaint of sore throat and cough that have been going on x2 days. States he has had a fever as well. Has been taking OTC mucinex that has helped with sxs.   The course has been intermittent.  He initially felt that on Wednesday night and Thursday morning but symptoms seem to moderate somewhat. They came back again last night. He's had what he considers to be a migraine. He says he knows how to treat his own migraine but would like to be evaluated for a sore throat.  Patient has vomited twice today and still has the migraine from this morning. He would like some in for nausea.  No abdominal pain, diarrhea.  Past Medical History:  Diagnosis Date  . Allergy   . Chest pain    (-) stress test 2008, 2010  . Headache   . Hyperlipemia   . Hypertension    Family History  Problem Relation Age of Onset  . Prostate cancer Other        uncle  . CAD Father        F first MI ~ 11, GF, uncle   . Heart disease Father   . Hyperlipidemia Father   . Hypertension Father   . Stroke Other        GF  . Lupus Mother   . Diabetes Paternal Grandmother   . Heart disease Paternal Grandfather   . Stroke Paternal Grandfather   . Colon cancer Neg Hx    Social History   Socioeconomic History  . Marital status: Married    Spouse name: Not on file  . Number of children: 0  . Years of education: Not on file  . Highest education level: Not on file  Occupational History  . Occupation: tax preparation   Social Needs  . Financial resource strain: Not on file  . Food insecurity:    Worry: Not on file    Inability: Not on file  . Transportation needs:    Medical: Not on file    Non-medical: Not on file  Tobacco Use  . Smoking status: Never Smoker  . Smokeless tobacco: Never Used  Substance and Sexual Activity  . Alcohol use: Yes    Comment:  rare  . Drug use: No  . Sexual activity: Not on file  Lifestyle  . Physical activity:    Days per week: Not on file    Minutes per session: Not on file  . Stress: Not on file  Relationships  . Social connections:    Talks on phone: Not on file    Gets together: Not on file    Attends religious service: Not on file    Active member of club or organization: Not on file    Attends meetings of clubs or organizations: Not on file    Relationship status: Not on file  . Intimate partner violence:    Fear of current or ex partner: Not on file    Emotionally abused: Not on file    Physically abused: Not on file    Forced sexual activity: Not on file  Other Topics Concern  . Not on file  Social History Narrative  . Not on file   Current Meds  Medication Sig  . aspirin 81 MG tablet Take 81 mg by mouth daily.  . fish oil-omega-3  fatty acids 1000 MG capsule Take 2 g by mouth daily.  . Glucos-Chondroit-Hyaluron-MSM (GLUCOSAMINE CHONDROITIN JOINT PO) Take 1,200 mg by mouth daily at 6 (six) AM.  . ipratropium (ATROVENT) 0.03 % nasal spray Place 2 sprays into both nostrils 2 (two) times daily.  Marland Kitchen ketoprofen (ORUDIS) 75 MG capsule Take 1 capsule (75 mg total) by mouth daily as needed for moderate pain. Always take w/ food  . lisinopril (PRINIVIL,ZESTRIL) 20 MG tablet Take 1 tablet (20 mg total) by mouth daily.   No Known Allergies    ROS: As per HPI, remainder of ROS negative.     ROS: As per HPI, remainder of ROS negative.   OBJECTIVE:   Vitals:   06/12/17 1442  BP: (!) 156/98  Pulse: 78  Resp: 16  Temp: 98.4 F (36.9 C)  TempSrc: Oral  SpO2: 99%     General appearance: alert; no distress Eyes: PERRL; EOMI; conjunctiva normal HENT: normocephalic; atraumatic;  oral mucosa reddened Neck: supple Lungs: clear to auscultation bilaterally Heart: regular rate and rhythm Back: no CVA tenderness Extremities: no cyanosis or edema; symmetrical with no gross deformities Skin:  warm and dry Neurologic: normal gait; grossly normal Psychological: alert and cooperative; normal mood and affect      Labs:  Results for orders placed or performed in visit on 01/28/17  Comp Met (CMET)  Result Value Ref Range   Sodium 140 135 - 145 mEq/L   Potassium 3.9 3.5 - 5.1 mEq/L   Chloride 106 96 - 112 mEq/L   CO2 27 19 - 32 mEq/L   Glucose, Bld 108 (H) 70 - 99 mg/dL   BUN 24 (H) 6 - 23 mg/dL   Creatinine, Ser 1.07 0.40 - 1.50 mg/dL   Total Bilirubin 0.8 0.2 - 1.2 mg/dL   Alkaline Phosphatase 49 39 - 117 U/L   AST 20 0 - 37 U/L   ALT 22 0 - 53 U/L   Total Protein 7.7 6.0 - 8.3 g/dL   Albumin 4.5 3.5 - 5.2 g/dL   Calcium 9.0 8.4 - 10.5 mg/dL   GFR 94.43 >60.00 mL/min  Lipid panel  Result Value Ref Range   Cholesterol 150 0 - 200 mg/dL   Triglycerides 229.0 (H) 0.0 - 149.0 mg/dL   HDL 37.60 (L) >39.00 mg/dL   VLDL 45.8 (H) 0.0 - 40.0 mg/dL   Total CHOL/HDL Ratio 4    NonHDL 112.82   CBC w/Diff  Result Value Ref Range   WBC 6.3 4.0 - 10.5 K/uL   RBC 5.42 4.22 - 5.81 Mil/uL   Hemoglobin 15.5 13.0 - 17.0 g/dL   HCT 46.1 39.0 - 52.0 %   MCV 85.0 78.0 - 100.0 fl   MCHC 33.6 30.0 - 36.0 g/dL   RDW 14.1 11.5 - 15.5 %   Platelets 274.0 150.0 - 400.0 K/uL   Neutrophils Relative % 61.5 43.0 - 77.0 %   Lymphocytes Relative 30.3 12.0 - 46.0 %   Monocytes Relative 6.8 3.0 - 12.0 %   Eosinophils Relative 1.2 0.0 - 5.0 %   Basophils Relative 0.2 0.0 - 3.0 %   Neutro Abs 3.9 1.4 - 7.7 K/uL   Lymphs Abs 1.9 0.7 - 4.0 K/uL   Monocytes Absolute 0.4 0.1 - 1.0 K/uL   Eosinophils Absolute 0.1 0.0 - 0.7 K/uL   Basophils Absolute 0.0 0.0 - 0.1 K/uL  TSH  Result Value Ref Range   TSH 2.28 0.35 - 4.50 uIU/mL  LDL cholesterol, direct  Result  Value Ref Range   Direct LDL 62.0 mg/dL    Labs Reviewed  CULTURE, GROUP A STREP [facility])    No results found.     ASSESSMENT & PLAN:  1. Viral pharyngitis   2. Migraine without aura and without status migrainosus, not  intractable     Meds ordered this encounter  Medications  . chlorhexidine (PERIDEX) 0.12 % solution    Sig: Use as directed 15 mLs in the mouth or throat 2 (two) times daily.    Dispense:  120 mL    Refill:  0  . ondansetron (ZOFRAN-ODT) 8 MG disintegrating tablet    Sig: Take 1 tablet (8 mg total) by mouth every 8 (eight) hours as needed for nausea.    Dispense:  12 tablet    Refill:  0    Reviewed expectations re: course of current medical issues. Questions answered. Outlined signs and symptoms indicating need for more acute intervention. Patient verbalized understanding. After Visit Summary given.      Robyn Haber, MD 06/12/17 1510    Robyn Haber, MD 06/12/17 1515

## 2017-06-12 NOTE — ED Triage Notes (Signed)
Pt presents today with sore throat and cough that have been going on x2 days. States he has had a fever as well. Has been taking OTC mucinex that has helped with sxs.

## 2017-06-14 LAB — CULTURE, GROUP A STREP (THRC)

## 2017-06-15 ENCOUNTER — Encounter: Payer: Self-pay | Admitting: Internal Medicine

## 2017-06-16 ENCOUNTER — Telehealth: Payer: Self-pay

## 2017-06-16 NOTE — Telephone Encounter (Signed)
Copied from CRM [id number]. Topic: Appointment Scheduling - Scheduling Inquiry for Clinic >> Jun 16, 2017  8:19 AM Oneal Grout wrote: Reason for CRM: Requesting appt for today w/ Dr Drue Novel, see mychart message

## 2017-06-16 NOTE — Telephone Encounter (Signed)
Called Marcus Santiago and offered to schedule him today with another provider. Marcus Santiago declined, stating that he would prefer to see Dr. Drue Novel. I advised that he could be seen tomorrow morning and he decided to make an appt for then.

## 2017-06-16 NOTE — Telephone Encounter (Signed)
PCP is full today- recommend he see another provider today if they have any openings.

## 2017-06-17 ENCOUNTER — Ambulatory Visit (INDEPENDENT_AMBULATORY_CARE_PROVIDER_SITE_OTHER): Payer: BLUE CROSS/BLUE SHIELD | Admitting: Internal Medicine

## 2017-06-17 ENCOUNTER — Encounter: Payer: Self-pay | Admitting: Internal Medicine

## 2017-06-17 VITALS — BP 128/80 | HR 74 | Temp 98.2°F | Resp 14 | Ht 71.5 in | Wt 217.5 lb

## 2017-06-17 DIAGNOSIS — I10 Essential (primary) hypertension: Secondary | ICD-10-CM

## 2017-06-17 DIAGNOSIS — G43009 Migraine without aura, not intractable, without status migrainosus: Secondary | ICD-10-CM

## 2017-06-17 DIAGNOSIS — B349 Viral infection, unspecified: Secondary | ICD-10-CM | POA: Diagnosis not present

## 2017-06-17 NOTE — Progress Notes (Signed)
Subjective:    Patient ID: Marcus Santiago, male    DOB: 05-22-1965, 52 y.o.   MRN: [id number]  DOS:  06/17/2017 Type of visit - description : Acute visit Interval history: See patient messages. Symptoms started 06/09/2017 with sudden onset of sore throat. He also developed runny nose, aches, sneezing, cough. Had a temperature of 98.9. Had a headache, started with nausea and vomiting 06/12/2017.  Migraine?Micah Flesher to urgent care 06/12/2017.  Was prescribed a nausea medication and ketoprofen Also had some constipation.  Appetite decreased.    Review of Systems At this point, he is feeling better.  Appetite still slightly low. Still having mild phlegm accumulation on the throat, runny nose and ear congestion. Currently with no fever, chills. Headache is gone. No nausea or vomiting but has some ill-defined stomach discomfort like "knots". Denies any neck pain per se but has some stiffness. No rash.  Past Medical History:  Diagnosis Date  . Allergy   . Chest pain    (-) stress test 2008, 2010  . Headache   . Hyperlipemia   . Hypertension     Past Surgical History:  Procedure Laterality Date  . KNEE ARTHROSCOPY     L x 2    Social History   Socioeconomic History  . Marital status: Married    Spouse name: Not on file  . Number of children: 0  . Years of education: Not on file  . Highest education level: Not on file  Occupational History  . Occupation: tax preparation   Social Needs  . Financial resource strain: Not on file  . Food insecurity:    Worry: Not on file    Inability: Not on file  . Transportation needs:    Medical: Not on file    Non-medical: Not on file  Tobacco Use  . Smoking status: Never Smoker  . Smokeless tobacco: Never Used  Substance and Sexual Activity  . Alcohol use: Yes    Comment: rare  . Drug use: No  . Sexual activity: Not on file  Lifestyle  . Physical activity:    Days per week: Not on file    Minutes per session: Not on file    . Stress: Not on file  Relationships  . Social connections:    Talks on phone: Not on file    Gets together: Not on file    Attends religious service: Not on file    Active member of club or organization: Not on file    Attends meetings of clubs or organizations: Not on file    Relationship status: Not on file  . Intimate partner violence:    Fear of current or ex partner: Not on file    Emotionally abused: Not on file    Physically abused: Not on file    Forced sexual activity: Not on file  Other Topics Concern  . Not on file  Social History Narrative  . Not on file      Allergies as of 06/17/2017   No Known Allergies     Medication List        Accurate as of 06/17/17  5:57 PM. Always use your most recent med list.          aspirin 81 MG tablet Take 81 mg by mouth daily.   chlorhexidine 0.12 % solution Commonly known as:  PERIDEX Use as directed 15 mLs in the mouth or throat 2 (two) times daily.   fish oil-omega-3 fatty acids  1000 MG capsule Take 2 g by mouth daily.   GLUCOSAMINE CHONDROITIN JOINT PO Take 1,200 mg by mouth daily at 6 (six) AM.   ipratropium 0.03 % nasal spray Commonly known as:  ATROVENT Place 2 sprays into both nostrils 2 (two) times daily.   ketoprofen 75 MG capsule Commonly known as:  ORUDIS Take 1 capsule (75 mg total) by mouth daily as needed for moderate pain. Always take w/ food   lisinopril 20 MG tablet Commonly known as:  PRINIVIL,ZESTRIL Take 1 tablet (20 mg total) by mouth daily.   ondansetron 8 MG disintegrating tablet Commonly known as:  ZOFRAN-ODT Take 1 tablet (8 mg total) by mouth every 8 (eight) hours as needed for nausea.          Objective:   Physical Exam BP 128/80 (BP Location: Left Arm, Patient Position: Sitting, Cuff Size: Normal)   Pulse 74   Temp 98.2 F (36.8 C) (Oral)   Resp 14   Ht 5' 11.5" (1.816 m)   Wt 217 lb 8 oz (98.7 kg)   SpO2 98%   BMI 29.91 kg/m  General:   Well developed, well  nourished . NAD.  HEENT:  Normocephalic . Face symmetric, atraumatic.  TMs: Moderately bulge on the right, no redness.  Normal on the left. Throat: Symmetric, no red Neck: Supple, range of motion normal Lungs:  CTA B Normal respiratory effort, no intercostal retractions, no accessory muscle use. Heart: RRR,  no murmur.  No pretibial edema bilaterally  Skin: Not pale. Not jaundice Neurologic:  alert & oriented X3.  Speech normal, gait appropriate for age and unassisted Psych--  Cognition and judgment appear intact.  Cooperative with normal attention span and concentration.  Behavior appropriate. No anxious or depressed appearing.      Assessment & Plan:   Assessment> HTN Hyperlipidemia Chest pain : (-) stress test 2008, 2010  PLAN   Viral syndrome: Symptoms consistent with viral syndrome, strep test and throat culture negative at the urgent care, clinically better. Headache, nausea, neck stiffness: Reports a history of previous migraines.  Suspect symptoms were a migraine triggered by acute illness.  Patient is quite concerned about the neck stiffness, that is for sure a worrisome symptom however he is improving, has no further fever, no rash, exam is reassuring.  In the context of viral syndrome, neck stiffness is probably secondary to that.  We agreed on close observation, will call if needed.   HTN: On ACE inhibitors, he has been able to drink plenty of fluids.  BP today is very good.  Encourage good hydration. RTC as scheduled for next month, CPX  Today, I spent more than  25  min with the patient: >50% of the time counseling regards neck stiffness, see above. Also reviewing the chart

## 2017-06-17 NOTE — Patient Instructions (Signed)
Rest, fluids , tylenol  For cough:  Take Mucinex DM twice a day as needed until better  For nasal congestion: Use OTC Nasocort or Flonase : 2 nasal sprays on each side of the nose in the morning until you feel better  Avoid decongestants such as  Pseudoephedrine or phenylephrine   Call if not gradually better over the next  10 days  Call anytime if the symptoms are severe 

## 2017-06-17 NOTE — Progress Notes (Signed)
Pre visit review using our clinic review tool, if applicable. No additional management support is needed unless otherwise documented below in the visit note. 

## 2017-06-17 NOTE — Assessment & Plan Note (Signed)
Viral syndrome: Symptoms consistent with viral syndrome, strep test and throat culture negative at the urgent care, clinically better. Headache, nausea, neck stiffness: Reports a history of previous migraines.  Suspect symptoms were a migraine triggered by acute illness.  Patient is quite concerned about the neck stiffness, that is for sure a worrisome symptom however he is improving, has no further fever, no rash, exam is reassuring.  In the context of viral syndrome, neck stiffness is probably secondary to that.  We agreed on close observation, will call if needed.   HTN: On ACE inhibitors, he has been able to drink plenty of fluids.  BP today is very good.  Encourage good hydration. RTC as scheduled for next month, CPX

## 2017-07-01 ENCOUNTER — Ambulatory Visit: Payer: BLUE CROSS/BLUE SHIELD | Admitting: Family Medicine

## 2017-07-06 ENCOUNTER — Encounter: Payer: BLUE CROSS/BLUE SHIELD | Admitting: Internal Medicine

## 2017-07-23 ENCOUNTER — Other Ambulatory Visit: Payer: Self-pay

## 2017-07-23 ENCOUNTER — Encounter (HOSPITAL_BASED_OUTPATIENT_CLINIC_OR_DEPARTMENT_OTHER): Payer: Self-pay

## 2017-07-23 ENCOUNTER — Emergency Department (HOSPITAL_BASED_OUTPATIENT_CLINIC_OR_DEPARTMENT_OTHER)
Admission: EM | Admit: 2017-07-23 | Discharge: 2017-07-23 | Disposition: A | Discharge status: Home / Self Care | Payer: BLUE CROSS/BLUE SHIELD | Attending: Emergency Medicine | Admitting: Emergency Medicine

## 2017-07-23 DIAGNOSIS — R202 Paresthesia of skin: Secondary | ICD-10-CM | POA: Insufficient documentation

## 2017-07-23 DIAGNOSIS — Z7982 Long term (current) use of aspirin: Secondary | ICD-10-CM | POA: Insufficient documentation

## 2017-07-23 DIAGNOSIS — I10 Essential (primary) hypertension: Secondary | ICD-10-CM | POA: Insufficient documentation

## 2017-07-23 DIAGNOSIS — Z79899 Other long term (current) drug therapy: Secondary | ICD-10-CM | POA: Insufficient documentation

## 2017-07-23 NOTE — ED Triage Notes (Signed)
Pt presents with numbness to arms/legs x 2 days, also complaining of HA.

## 2017-07-23 NOTE — ED Provider Notes (Signed)
[facility] EMERGENCY DEPARTMENT Provider Note   CSN: [id number] Arrival date & time: 07/23/17  2155     History   Chief Complaint Chief Complaint  Patient presents with  . Numbness    HPI Marcus Santiago is a 52 y.o. male.  HPI Patient has had several areas of numbness.  He reports that his right great toe was numb for about a week.  He reports he could never identify anything that was visually wrong with it.  That has now improved.  He reports the toe feels kind of sensitive but is no longer numb.  He reports yesterday he had an episode of feeling numbness in the left hand.  That lasted for much of the day and then resolved.  He reports today he had an episode of feeling like both hands felt numb.  That resolved just prior to coming to the emergency department.  Currently he does not have active symptoms.  Patient reports he is also had some dull headaches.  He reports they have not been severe.  They have been on a couple different areas of his head.  Patient does have history of migraines.  He reports he has 2 types.  One is with severe migraines that have associated nausea and vomiting.  The other is a more subacute migraine with which she is typically less symptomatic.  Patient denies with his migraines is ever experienced paresthesias numbness or weakness.  Patient reports he is also has some posterior neck stiffness that has waxed and waned over the past several weeks.  He reports he had a concern for meningitis due to the neck symptoms.  He has not had fevers or chills.  No ENT symptoms.  He currently does not have any headache. Past Medical History:  Diagnosis Date  . Allergy   . Chest pain    (-) stress test 2008, 2010  . Headache   . Hyperlipemia   . Hypertension     Patient Active Problem List   Diagnosis Date Noted  . PCP NOTES >>> 11/07/2014  . Annual physical exam 07/04/2012  . HYPERLIPIDEMIA 10/25/2007  . Migraine without aura 10/15/2006  . Essential  hypertension 08/30/2006    Past Surgical History:  Procedure Laterality Date  . KNEE ARTHROSCOPY     L x 2        Home Medications    Prior to Admission medications   Medication Sig Start Date End Date Taking? Authorizing Provider  ondansetron (ZOFRAN-ODT) 8 MG disintegrating tablet Take 1 tablet (8 mg total) by mouth every 8 (eight) hours as needed for nausea. 06/12/17  Yes Elvina Sidle, MD  aspirin 81 MG tablet Take 81 mg by mouth daily.    [provider]  chlorhexidine (PERIDEX) 0.12 % solution Use as directed 15 mLs in the mouth or throat 2 (two) times daily. 06/12/17   Elvina Sidle, MD  fish oil-omega-3 fatty acids 1000 MG capsule Take 2 g by mouth daily.    [provider]  Glucos-Chondroit-Hyaluron-MSM (GLUCOSAMINE CHONDROITIN JOINT PO) Take 1,200 mg by mouth daily at 6 (six) AM.    [provider]  ipratropium (ATROVENT) 0.03 % nasal spray Place 2 sprays into both nostrils 2 (two) times daily. 12/22/16   McVey, Madelaine Bhat, PA-C  ketoprofen (ORUDIS) 75 MG capsule Take 1 capsule (75 mg total) by mouth daily as needed for moderate pain. Always take w/ food 10/19/16   Wanda Plump, MD  lisinopril (PRINIVIL,ZESTRIL) 20 MG tablet  Take 1 tablet (20 mg total) by mouth daily. 06/30/16   Wanda Plump, MD    Family History Family History  Problem Relation Age of Onset  . Prostate cancer Other        uncle  . CAD Father        F first MI ~ 46, GF, uncle   . Heart disease Father   . Hyperlipidemia Father   . Hypertension Father   . Stroke Other        GF  . Lupus Mother   . Diabetes Paternal Grandmother   . Heart disease Paternal Grandfather   . Stroke Paternal Grandfather   . Colon cancer Neg Hx     Social History Social History   Tobacco Use  . Smoking status: Never Smoker  . Smokeless tobacco: Never Used  Substance Use Topics  . Alcohol use: Yes    Comment: rare  . Drug use: No     Allergies   Patient has no known  allergies.   Review of Systems Review of Systems 10 Systems reviewed and are negative for acute change except as noted in the HPI.  Physical Exam Updated Vital Signs BP (!) 145/97   Pulse 88   Temp 98.4 F (36.9 C) (Oral)   Resp 18   Ht 5\' 10"  (1.778 m)   Wt 99.8 kg (220 lb)   SpO2 98%   BMI 31.57 kg/m   Physical Exam  Constitutional: He is oriented to person, place, and time. He appears well-developed and well-nourished.  Patient is clinically well in appearance.  He is alert and in no distress.  He sitting up in the chair beside the bed.  He has good general physical conditioning.  Well-groomed with personal maintained appearance.  HENT:  Head: Normocephalic and atraumatic.  Nose: Nose normal.  Mouth/Throat: Oropharynx is clear and moist.  Eyes: Pupils are equal, round, and reactive to light. EOM are normal.  Symmetric pupillary responses.  Normal efferent response contralateral pupil  Neck: Neck supple. No thyromegaly present.  Cardiovascular: Normal rate, regular rhythm, normal heart sounds and intact distal pulses.  Pulmonary/Chest: Effort normal and breath sounds normal.  Abdominal: Soft. Bowel sounds are normal. He exhibits no distension. There is no tenderness.  Musculoskeletal: Normal range of motion. He exhibits no edema.  Lymphadenopathy:    He has no cervical adenopathy.  Neurological: He is alert and oriented to person, place, and time. He has normal strength. No cranial nerve deficit or sensory deficit. He exhibits normal muscle tone. Coordination normal. GCS eye subscore is 4. GCS verbal subscore is 5. GCS motor subscore is 6.  Normal finger-nose exam bilaterally brisk.  No pronator drift.  5\5 upper extremity motor strength.  5\5 lower extremity motor strength.  Sensation intact to light touch x4 extremities.  Skin: Skin is warm, dry and intact.  Psychiatric: He has a normal mood and affect.     ED Treatments / Results  Labs (all labs ordered are listed,  but only abnormal results are displayed) Labs Reviewed - No data to display  EKG None  Radiology No results found.  Procedures Procedures (including critical care time)  Medications Ordered in ED Medications - No data to display   Initial Impression / Assessment and Plan / ED Course  I have reviewed the triage vital signs and the nursing notes.  Pertinent labs & imaging results that were available during my care of the patient were reviewed by me and considered in my  medical decision making (see chart for details).      Final Clinical Impressions(s) / ED Diagnoses   Final diagnoses:  Paresthesia   Patient presents with waxing and waning and migratory paresthesias.  First it was localized to the patient's right great toe.  He subsequently had left hand paresthesias that resolved and then subsequently had bilateral hand paresthesias and also resolved.  He is also had migratory nonsevere headaches.  Patient's clinical appearance is excellent.  He is well-nourished and well-developed physically.  He has normal neurologic examination.  Patient has known history of migraines.  Consideration is for migraine variant.  Consideration also for possible MS.  Patient is counseled on the nature of this and further diagnostic work-up is needed.  He has no active symptoms at this time.  His symptoms sound to be very low probability to be stroke with the migratory and bilateral quality.  One of patient's main concerns is for meningitis.  Nothing of his appearance or history suggests meningitis.  More distant consideration for cervical radiculopathy.  Patient however does not have pain radiating into the extremities.  He does not have actual weakness of the extremities.  Very low suspicion for CVA given migratory sensory complaints.  Initially paresthesia was right great toe, at another time bilateral hands.  Clinically patient has very well appearance.  At this time I think he is stable for continued  outpatient evaluation.  Recommendations that he would follow-up with neurology and see his PCP as soon as possible.  Patient counseled on modality for diagnostic imaging of possible MS.  Patient advised to return if he is having active symptoms or new evolving symptoms. ED Discharge Orders    None       Arby BarrettePfeiffer, Lucita Montoya, MD 07/23/17 2336

## 2017-07-25 ENCOUNTER — Encounter: Payer: Self-pay | Admitting: Internal Medicine

## 2017-07-27 ENCOUNTER — Encounter: Payer: Self-pay | Admitting: Internal Medicine

## 2017-07-30 ENCOUNTER — Encounter: Payer: Self-pay | Admitting: Internal Medicine

## 2017-07-30 ENCOUNTER — Ambulatory Visit: Payer: Self-pay | Admitting: Internal Medicine

## 2017-07-30 VITALS — BP 136/84 | HR 87 | Temp 98.8°F | Resp 14 | Ht 71.5 in | Wt 219.0 lb

## 2017-07-30 DIAGNOSIS — R202 Paresthesia of skin: Secondary | ICD-10-CM

## 2017-07-30 DIAGNOSIS — J069 Acute upper respiratory infection, unspecified: Secondary | ICD-10-CM

## 2017-07-30 DIAGNOSIS — M542 Cervicalgia: Secondary | ICD-10-CM

## 2017-07-30 DIAGNOSIS — R51 Headache: Secondary | ICD-10-CM

## 2017-07-30 DIAGNOSIS — R519 Headache, unspecified: Secondary | ICD-10-CM

## 2017-07-30 DIAGNOSIS — R200 Anesthesia of skin: Secondary | ICD-10-CM

## 2017-07-30 MED ORDER — BENZONATATE 200 MG PO CAPS: 200.0000 mg | ORAL_CAPSULE | Freq: Three times a day (TID) | ORAL | 0 refills | Status: DC | PRN

## 2017-07-30 MED ORDER — AZITHROMYCIN 250 MG PO TABS: ORAL_TABLET | ORAL | 0 refills | Status: DC

## 2017-07-30 NOTE — Progress Notes (Signed)
Subjective:    Patient ID: Marcus Santiago, male    DOB: 11/25/1965, 52 y.o.   MRN: [id number]  DOS:  07/30/2017 Type of visit - description : acute  Interval history: Here with multiple concerns.  I reviewed the ER note.  I asked him to summarize all his symptoms: He is having tingling and numbness at different places at different times as well as dull, moderate left-sided headaches. The tingling and numbness is predominantly on the right side of his body. Last episode was yesterday with numbness at the right forearm. There was no associated dizziness, diplopia, motor deficits. He never had a diagnosis such as optical neuritis.  Also, having URI symptoms since 07/18/2017.  Wife has similar symptoms. Currently he having persistent cough despite Mucinex, occasional phlegm pulled out the throat, sore throat, runny nose. No fever chills  Also, neck soreness, mostly on the left trapezoid area.  Symptoms started on long with the URI.  Review of Systems  See above Past Medical History:  Diagnosis Date  . Allergy   . Chest pain    (-) stress test 2008, 2010  . Headache   . Hyperlipemia   . Hypertension     Past Surgical History:  Procedure Laterality Date  . KNEE ARTHROSCOPY     L x 2    Social History   Socioeconomic History  . Marital status: Married    Spouse name: Not on file  . Number of children: 0  . Years of education: Not on file  . Highest education level: Not on file  Occupational History  . Occupation: tax preparation   Social Needs  . Financial resource strain: Not on file  . Food insecurity:    Worry: Not on file    Inability: Not on file  . Transportation needs:    Medical: Not on file    Non-medical: Not on file  Tobacco Use  . Smoking status: Never Smoker  . Smokeless tobacco: Never Used  Substance and Sexual Activity  . Alcohol use: Yes    Comment: rare  . Drug use: No  . Sexual activity: Not on file  Lifestyle  . Physical activity:   Days per week: Not on file    Minutes per session: Not on file  . Stress: Not on file  Relationships  . Social connections:    Talks on phone: Not on file    Gets together: Not on file    Attends religious service: Not on file    Active member of club or organization: Not on file    Attends meetings of clubs or organizations: Not on file    Relationship status: Not on file  . Intimate partner violence:    Fear of current or ex partner: Not on file    Emotionally abused: Not on file    Physically abused: Not on file    Forced sexual activity: Not on file  Other Topics Concern  . Not on file  Social History Narrative  . Not on file      Allergies as of 07/30/2017   No Known Allergies     Medication List        Accurate as of 07/30/17 11:07 AM. Always use your most recent med list.          aspirin 81 MG tablet Take 81 mg by mouth daily.   chlorhexidine 0.12 % solution Commonly known as:  PERIDEX Use as directed 15 mLs in the mouth or  throat 2 (two) times daily.   fish oil-omega-3 fatty acids 1000 MG capsule Take 2 g by mouth daily.   GLUCOSAMINE CHONDROITIN JOINT PO Take 1,200 mg by mouth daily at 6 (six) AM.   ipratropium 0.03 % nasal spray Commonly known as:  ATROVENT Place 2 sprays into both nostrils 2 (two) times daily.   ketoprofen 75 MG capsule Commonly known as:  ORUDIS Take 1 capsule (75 mg total) by mouth daily as needed for moderate pain. Always take w/ food   lisinopril 20 MG tablet Commonly known as:  PRINIVIL,ZESTRIL Take 1 tablet (20 mg total) by mouth daily.   ondansetron 8 MG disintegrating tablet Commonly known as:  ZOFRAN-ODT Take 1 tablet (8 mg total) by mouth every 8 (eight) hours as needed for nausea.          Objective:   Physical Exam BP 136/84 (BP Location: Left Arm, Patient Position: Sitting, Cuff Size: Normal)   Pulse 87   Temp 98.8 F (37.1 C) (Oral)   Resp 14   Ht 5' 11.5" (1.816 m)   Wt 219 lb (99.3 kg)   SpO2 97%    BMI 30.12 kg/m  General:   Well developed, NAD, see BMI.  HEENT:  Normocephalic . Face symmetric, atraumatic. TMs bulged but not red, canals normal.  Throat symmetric, no red.  Nose congested. Lungs:  CTA B Normal respiratory effort, no intercostal retractions, no accessory muscle use. Heart: RRR,  no murmur.  No pretibial edema bilaterally  Skin: Not pale. Not jaundice Neurologic:  alert & oriented X3.  Speech normal, gait appropriate for age and unassisted EOMI, pupils equal and reactive.  DTR symmetric.  Motor symmetric Psych--  Cognition and judgment appear intact.  Cooperative with normal attention span and concentration.  Behavior appropriate. No anxious or depressed appearing.      Assessment & Plan:    Assessment> HTN Hyperlipidemia Chest pain : (-) stress test 2008, 2010  PLAN  Paresthesias, headaches: Patient is concerned about symptoms representing meningitis, brain aneurysm, strokes.  Those diagnoses are unlikely on clinical grounds, the ER MD raise the question of MS which -given symptoms-  is in the DDX. We discussed different approaches to his sxs  from getting a plain MRI, to refer to neurology.  Patient is very concerned likes to be sure he does not have an aneurysm. Plan: Neuro referral, brain MRI, brain MRA. URI: Symptoms consistent with a URI, I recommend conservative treatment, he is very adamant about getting antibiotics.  Plan: Conservative treatment see instructions, Z-Pak if not improving. Neck pain:  He was seen with neck pain several weeks ago, it completely went away and only came back when he developed recent URI symptoms, observation for now.

## 2017-07-30 NOTE — Progress Notes (Signed)
Pre visit review using our clinic review tool, if applicable. No additional management support is needed unless otherwise documented below in the visit note. 

## 2017-07-30 NOTE — Patient Instructions (Signed)
Rest, fluids , tylenol  For cough:  Continue Mucinex DM twice a day as needed until better You can also use Tessalon Perles as needed   For nasal congestion: Use OTC Nasocort or Flonase : 2 nasal sprays on each side of the nose in the morning until you feel better   Take the antibiotic as prescribed (Zithromax) only if you are not improving in the next few days  Call anytime if the symptoms are severe, you have high fever, short of breath, chest pain

## 2017-07-31 NOTE — Assessment & Plan Note (Signed)
Paresthesias, headaches: Patient is concerned about symptoms representing meningitis, brain aneurysm, strokes.  Those diagnoses are unlikely on clinical grounds, the ER MD raise the question of MS which -given symptoms-  is in the DDX. We discussed different approaches to his sxs  from getting a plain MRI, to refer to neurology.  Patient is very concerned likes to be sure he does not have an aneurysm. Plan: Neuro referral, brain MRI, brain MRA. URI: Symptoms consistent with a URI, I recommend conservative treatment, he is very adamant about getting antibiotics.  Plan: Conservative treatment see instructions, Z-Pak if not improving. Neck pain:  He was seen with neck pain several weeks ago, it completely went away and only came back when he developed recent URI symptoms, observation for now.

## 2017-08-31 ENCOUNTER — Encounter: Payer: Self-pay | Admitting: Neurology

## 2017-09-01 ENCOUNTER — Telehealth: Payer: Self-pay

## 2017-09-01 ENCOUNTER — Encounter: Payer: Self-pay | Admitting: Internal Medicine

## 2017-09-01 ENCOUNTER — Encounter: Payer: Self-pay | Admitting: Physician Assistant

## 2017-09-01 NOTE — Telephone Encounter (Signed)
Spoke w/ Pt- informed Dr. Patsy Lageropland signed in PCP absence- still need signature on his form for medical release of information- he will be by later today to sign and pick up form.

## 2017-09-01 NOTE — Telephone Encounter (Signed)
I was asked to review and complete adoption PPW for this pat- his PCP is out of town and this is an urgent matter.  I have reviewed chart and am able to sign form for him today

## 2017-09-01 NOTE — Telephone Encounter (Signed)
Received adoption physical forms from [facility]- informed that PCP is out of the office until 8/20- forms completed to be the best of my ability- informed Pt that he and his wife will need to come by office to sign forms so we can release medical information. Form placed at front desk for Pt signature.

## 2017-09-02 ENCOUNTER — Telehealth: Payer: Self-pay | Admitting: Internal Medicine

## 2017-09-02 NOTE — Telephone Encounter (Unsigned)
Copied from CRM [phone]. Topic: Quick Communication - See Telephone Encounter >> Sep 02, 2017 12:54 PM Raquel SarnaHayes, Teresa G wrote: [facility] pt needing a physical for adoption of recent birth of baby.  Baby would go into foster care if physical not completed in time.

## 2017-09-02 NOTE — Telephone Encounter (Unsigned)
Copied from CRM #[id number]. Topic: Quick Communication - See Telephone Encounter °>> Sep 02, 2017 12:54 PM Hayes, Teresa G wrote: °[place] pt needing a physical for adoption of recent birth of baby.  Baby would go into foster care if physical not completed in time. °

## 2017-09-03 ENCOUNTER — Encounter: Payer: Self-pay | Admitting: Emergency Medicine

## 2017-09-06 ENCOUNTER — Other Ambulatory Visit: Payer: Self-pay | Admitting: Physician Assistant

## 2017-09-06 NOTE — Telephone Encounter (Signed)
Patient called and advised he is due for a physical, he says "Let me call you back, I have another call."  Lisinopril refill Last Refill:06/30/16 # 90/3 refill Last OV: 06/30/16 PCP: Drue NovelPaz [id number] [place] - [address] [facility] [address] [phone] (Phone) [phone] (Fax)

## 2017-09-07 NOTE — Telephone Encounter (Signed)
mychart message sent. Patient will need to schedule an appointment we have not seen patient to fill out form that was sent.

## 2017-09-09 ENCOUNTER — Ambulatory Visit (INDEPENDENT_AMBULATORY_CARE_PROVIDER_SITE_OTHER): Payer: BLUE CROSS/BLUE SHIELD | Admitting: Physician Assistant

## 2017-09-09 ENCOUNTER — Encounter: Payer: Self-pay | Admitting: Physician Assistant

## 2017-09-09 ENCOUNTER — Other Ambulatory Visit: Payer: Self-pay

## 2017-09-09 VITALS — BP 134/86 | HR 78 | Temp 99.1°F | Resp 18 | Ht 71.46 in | Wt 212.0 lb

## 2017-09-09 DIAGNOSIS — R05 Cough: Secondary | ICD-10-CM

## 2017-09-09 DIAGNOSIS — J029 Acute pharyngitis, unspecified: Secondary | ICD-10-CM

## 2017-09-09 DIAGNOSIS — J302 Other seasonal allergic rhinitis: Secondary | ICD-10-CM

## 2017-09-09 DIAGNOSIS — R0982 Postnasal drip: Secondary | ICD-10-CM | POA: Diagnosis not present

## 2017-09-09 DIAGNOSIS — R059 Cough, unspecified: Secondary | ICD-10-CM

## 2017-09-09 NOTE — Patient Instructions (Addendum)
Your symptoms are consistent with allergies.  I recommend doing a 12-hour Claritin or Zyrtec in the morning along with Flonase.  At nighttime, do nasal saline rinses and Benadryl.  He may also use a coolmist humidifier at home.  If your symptoms persist, you may want to try over-the-counter acid reflux treatment with a Zantac or Prilosec as persistent cough can be the only presenting symptom of GERD.  If no improvement after 1 to 2 weeks with current treatment plan or your symptoms worsen or you develop new concerning symptoms, please seek care immediately.      Allergic Rhinitis, Adult Allergic rhinitis is an allergic reaction that affects the mucous membrane inside the nose. It causes sneezing, a runny or stuffy nose, and the feeling of mucus going down the back of the throat (postnasal drip). Allergic rhinitis can be mild to severe. There are two types of allergic rhinitis:  Seasonal. This type is also called hay fever. It happens only during certain seasons.  Perennial. This type can happen at any time of the year.  What are the causes? This condition happens when the body's defense system (immune system) responds to certain harmless substances called allergens as though they were germs.  Seasonal allergic rhinitis is triggered by pollen, which can come from grasses, trees, and weeds. Perennial allergic rhinitis may be caused by:  House dust mites.  Pet dander.  Mold spores.  What are the signs or symptoms? Symptoms of this condition include:  Sneezing.  Runny or stuffy nose (nasal congestion).  Postnasal drip.  Itchy nose.  Tearing of the eyes.  Trouble sleeping.  Daytime sleepiness.  How is this diagnosed? This condition may be diagnosed based on:  Your medical history.  A physical exam.  Tests to check for related conditions, such as: ? Asthma. ? Pink eye. ? Ear infection. ? Upper respiratory infection.  Tests to find out which allergens trigger your  symptoms. These may include skin or blood tests.  How is this treated? There is no cure for this condition, but treatment can help control symptoms. Treatment may include:  Taking medicines that block allergy symptoms, such as antihistamines. Medicine may be given as a shot, nasal spray, or pill.  Avoiding the allergen.  Desensitization. This treatment involves getting ongoing shots until your body becomes less sensitive to the allergen. This treatment may be done if other treatments do not help.  If taking medicine and avoiding the allergen does not work, new, stronger medicines may be prescribed.  Follow these instructions at home:  Find out what you are allergic to. Common allergens include smoke, dust, and pollen.  Avoid the things you are allergic to. These are some things you can do to help avoid allergens: ? Replace carpet with wood, tile, or vinyl flooring. Carpet can trap dander and dust. ? Do not smoke. Do not allow smoking in your home. ? Change your heating and air conditioning filter at least once a month. ? During allergy season:  Keep windows closed as much as possible.  Plan outdoor activities when pollen counts are lowest. This is usually during the evening hours.  When coming indoors, change clothing and shower before sitting on furniture or bedding.  Take over-the-counter and prescription medicines only as told by your health care provider.  Keep all follow-up visits as told by your health care provider. This is important. Contact a health care provider if:  You have a fever.  You develop a persistent cough.  You make  whistling sounds when you breathe (you wheeze).  Your symptoms interfere with your normal daily activities. Get help right away if:  You have shortness of breath. Summary  This condition can be managed by taking medicines as directed and avoiding allergens.  Contact your health care provider if you develop a persistent cough or  fever.  During allergy season, keep windows closed as much as possible. This information is not intended to replace advice given to you by your health care provider. Make sure you discuss any questions you have with your health care provider. Document Released: 09/30/2000 Document Revised: 02/13/2016 Document Reviewed: 02/13/2016 Elsevier Interactive Patient Education  2018 [employer].   IF you received an x-ray today, you will receive an invoice from [facility]. Please contact [facility] at [phone] with questions or concerns regarding your invoice.   IF you received labwork today, you will receive an invoice from [facility]. Please contact [facility] at [phone] with questions or concerns regarding your invoice.   Our billing staff will not be able to assist you with questions regarding bills from these companies.  You will be contacted with the lab results as soon as they are available. The fastest way to get your results is to activate your My Chart account. Instructions are located on the last page of this paperwork. If you have not heard from [place] regarding the results in 2 weeks, please contact this office.

## 2017-09-09 NOTE — Progress Notes (Signed)
MRN: [id number] DOB: 01/04/1966  Subjective:   Marcus Santiago is a 52 y.o. male presenting for chief complaint of Cough (X 2 days ) and Sore Throat (X 2 days) .  Reports 1 month history of on and off cough, sore throat, and nasal congestion.  Was seen at PCP office for sore throat and cough in 07/30/17. Tx with a zpack. Cough resolved. Sore throat never went fully away. Cough and sore throat are back in x  2 days ago. Cough is dry.  He does not feel like he is sick.  Is wondering if it is related to his allergies. Sx are worse at night time. Has tried flonase which does provide some relief. Denies fever, sinus pain, rhinorrhea, ear pain, difficulty swallowing, wheezing, shortness of breath, chest pain, myalgia and heartburn, night sweats, chills, fatigue, malaise, decreased appetite, weight loss, nausea, vomiting, abdominal pain and diarrhea. Has not had sick contact with. Has history of seasonal allergies, no history of asthma. Denies smoking.  Denies any other aggravating or relieving factors, no other questions or concerns.  Marcus Santiago has a current medication list which includes the following prescription(s): aspirin, azithromycin, benzonatate, chlorhexidine, fish oil-omega-3 fatty acids, glucos-chondroit-hyaluron-msm, ipratropium, ketoprofen, lisinopril, and ondansetron. Also has No Known Allergies.  Marcus Santiago  has a past medical history of Allergy, Chest pain, Headache, Hyperlipemia, and Hypertension. Also  has a past surgical history that includes Knee arthroscopy.   Objective:   Vitals: BP 134/86   Pulse 78   Temp 99.1 F (37.3 C) (Oral)   Resp 18   Ht 5' 11.46" (1.815 m)   Wt 212 lb (96.2 kg)   SpO2 98%   BMI 29.19 kg/m   Physical Exam  Constitutional: He is oriented to person, place, and time. He appears well-developed and well-nourished.  HENT:  Head: Normocephalic and atraumatic.  Right Ear: External ear and ear canal normal. Tympanic membrane is not erythematous and not  bulging. No middle ear effusion.  Left Ear: External ear and ear canal normal. Tympanic membrane is not erythematous and not bulging.  No middle ear effusion.  Nose: Mucosal edema (b/l nasal turbinates are boggy) present. Right sinus exhibits no maxillary sinus tenderness and no frontal sinus tenderness. Left sinus exhibits no maxillary sinus tenderness and no frontal sinus tenderness.  Mouth/Throat: Uvula is midline and mucous membranes are normal. Posterior oropharyngeal erythema (cobblestoning noted) present. Tonsils are 1+ on the right. Tonsils are 1+ on the left. No tonsillar exudate.  Eyes: Right conjunctiva is injected (mild). Left conjunctiva is injected (mild).  Neck: Normal range of motion.  Pulmonary/Chest: Effort normal and breath sounds normal. He has no wheezes. He has no rhonchi. He has no rales.  Neurological: He is alert and oriented to person, place, and time.  Skin: Skin is warm and dry.  Psychiatric: He has a normal mood and affect.  Vitals reviewed.   No results found for this or any previous visit (from the past 24 hour(s)).  . Assessment and Plan :  1. Seasonal allergies Patient is overall well-appearing, no acute distress.  Vitals stable. Lungs CTAB. He does not feel sick.  Symptoms are most consistent with seasonal allergies.  Sore throat and cough are likely due to postnasal drip.  Recommended starting a daily antihistamine, continuing Flonase, and starting nasal saline rinses at nighttime.  May also use Benadryl at nighttime to help with sleep.  Encouraged to follow-up with PCP if symptoms persist despite treatment plan.  2. PND (post-nasal  drip) 3. Sore throat 4. Cough  Benjiman Core, PA-C  Primary Care at [facility] 09/10/2017 4:50 PM

## 2017-09-16 ENCOUNTER — Ambulatory Visit: Payer: Self-pay | Admitting: Internal Medicine

## 2017-09-28 ENCOUNTER — Ambulatory Visit: Payer: Self-pay | Admitting: Neurology

## 2017-10-05 ENCOUNTER — Encounter: Payer: Self-pay | Admitting: Internal Medicine

## 2017-10-05 ENCOUNTER — Encounter: Payer: BLUE CROSS/BLUE SHIELD | Admitting: Internal Medicine

## 2017-10-13 ENCOUNTER — Encounter: Payer: Self-pay | Admitting: Internal Medicine

## 2017-10-29 ENCOUNTER — Ambulatory Visit (INDEPENDENT_AMBULATORY_CARE_PROVIDER_SITE_OTHER): Payer: BLUE CROSS/BLUE SHIELD | Admitting: Internal Medicine

## 2017-10-29 ENCOUNTER — Encounter: Payer: Self-pay | Admitting: Internal Medicine

## 2017-10-29 VITALS — BP 128/70 | HR 87 | Temp 98.7°F | Resp 16 | Ht 71.0 in | Wt 225.1 lb

## 2017-10-29 DIAGNOSIS — Z23 Encounter for immunization: Secondary | ICD-10-CM

## 2017-10-29 DIAGNOSIS — Z0000 Encounter for general adult medical examination without abnormal findings: Secondary | ICD-10-CM | POA: Diagnosis not present

## 2017-10-29 MED ORDER — AZELASTINE HCL 0.1 % NA SOLN: 2.0000 | Freq: Two times a day (BID) | NASAL | 6 refills | Status: DC

## 2017-10-29 NOTE — Progress Notes (Signed)
Subjective:    Patient ID: Marcus Santiago, male    DOB: [birth year], 52 y.o.   MRN: [id number]  DOS:  10/29/2017 Type of visit - description : cpx   Wt Readings from Last 3 Encounters:  10/29/17 225 lb 2 oz (102.1 kg)  09/09/17 212 lb (96.2 kg)  07/30/17 219 lb (99.3 kg)    Review of Systems Since the last office visit, headache and paresthesias stopped. Several months history of phlegm and mucus accumulation in the throat.  Occasional sore throat. Went to urgent care, was recommended Nasonex, no better. Denies sneezing, itchy eyes or nose.  No cough, no GERD symptoms.  Other than above, a 14 point review of systems is negative    Past Medical History:  Diagnosis Date  . Allergy   . Chest pain    (-) stress test 2008, 2010  . Headache   . Hyperlipemia   . Hypertension     Past Surgical History:  Procedure Laterality Date  . KNEE ARTHROSCOPY     L x 2    Social History   Socioeconomic History  . Marital status: Married    Spouse name: Not on file  . Number of children: 0  . Years of education: Not on file  . Highest education level: Not on file  Occupational History  . Occupation: tax preparation   Social Needs  . Financial resource strain: Not on file  . Food insecurity:    Worry: Not on file    Inability: Not on file  . Transportation needs:    Medical: Not on file    Non-medical: Not on file  Tobacco Use  . Smoking status: Never Smoker  . Smokeless tobacco: Never Used  Substance and Sexual Activity  . Alcohol use: Yes    Comment: rare  . Drug use: No  . Sexual activity: Yes    Partners: Female    Birth control/protection: Surgical  Lifestyle  . Physical activity:    Days per week: Not on file    Minutes per session: Not on file  . Stress: Not on file  Relationships  . Social connections:    Talks on phone: Not on file    Gets together: Not on file    Attends religious service: Not on file    Active member of club or organization: Not  on file    Attends meetings of clubs or organizations: Not on file    Relationship status: Not on file  . Intimate partner violence:    Fear of current or ex partner: Not on file    Emotionally abused: Not on file    Physically abused: Not on file    Forced sexual activity: Not on file  Other Topics Concern  . Not on file  Social History Narrative  . Not on file     Family History  Problem Relation Age of Onset  . Prostate cancer Other        uncle  . CAD Father        F first MI ~ 38, GF, uncle   . Heart disease Father   . Hyperlipidemia Father   . Hypertension Father   . Stroke Other        GF  . Lupus Mother   . Diabetes Paternal Grandmother   . Heart disease Paternal Grandfather   . Stroke Paternal Grandfather   . Colon cancer Neg Hx      Allergies as of 10/29/2017  No Known Allergies     Medication List        Accurate as of 10/29/17 11:59 PM. Always use your most recent med list.          aspirin 81 MG tablet Take 81 mg by mouth daily.   azelastine 0.1 % nasal spray Commonly known as:  ASTELIN Place 2 sprays into both nostrils 2 (two) times daily.   fish oil-omega-3 fatty acids 1000 MG capsule Take 2 g by mouth daily.   GLUCOSAMINE CHONDROITIN JOINT PO Take 1,200 mg by mouth daily at 6 (six) AM.   ketoprofen 75 MG capsule Commonly known as:  ORUDIS Take 1 capsule (75 mg total) by mouth daily as needed for moderate pain. Always take w/ food   lisinopril 20 MG tablet Commonly known as:  PRINIVIL,ZESTRIL Take 1 tablet (20 mg total) by mouth daily.          Objective:   Physical Exam BP 128/70 (BP Location: Left Arm, Patient Position: Sitting, Cuff Size: Normal)   Pulse 87   Temp 98.7 F (37.1 C) (Oral)   Resp 16   Ht 5\' 11"  (1.803 m)   Wt 225 lb 2 oz (102.1 kg)   SpO2 98%   BMI 31.40 kg/m  General: Well developed, NAD, see BMI.  Neck: No  thyromegaly  HEENT:  Normocephalic . Face symmetric, atraumatic.  TMs normal.  Throat  symmetric, no red. Neck: No unusual or worrisome lymph nodes Lungs:  CTA B Normal respiratory effort, no intercostal retractions, no accessory muscle use. Heart: RRR,  no murmur.  No pretibial edema bilaterally  Abdomen:  Not distended, soft, non-tender. No rebound or rigidity.   Rectal: External abnormalities: none. Normal sphincter tone. No rectal masses or tenderness.  No stools Prostate: Prostate gland firm and smooth, no enlargement, nodularity, tenderness, mass, asymmetry or induration Skin: Exposed areas without rash. Not pale. Not jaundice Neurologic:  alert & oriented X3.  Speech normal, gait appropriate for age and unassisted Strength symmetric and appropriate for age.  Psych: Cognition and judgment appear intact.  Cooperative with normal attention span and concentration.  Behavior appropriate. No anxious or depressed appearing.     Assessment & Plan:   Assessment HTN Hyperlipidemia Chest pain : (-) stress test 2008, 2010  PLAN  HTN: Controlled, continue lisinopril. Hyperlipidemia: Diet controlled, checking labs Paresthesias, headaches: See last visit, patient is now asymptomatic and declined further evaluation. Allergies?  See review of systems, he has a lot of mucus in the throat, statistically most likely postnasal dripping or GERD.  Recommend to continue Flonase, add Astelin twice daily.  Patient declined PPIs.  If not better she will let me know.  ENT?. RTC 1 year

## 2017-10-29 NOTE — Progress Notes (Signed)
Pre visit review using our clinic review tool, if applicable. No additional management support is needed unless otherwise documented below in the visit note. 

## 2017-10-29 NOTE — Assessment & Plan Note (Addendum)
--  Td booster  2018; flu shot today -- + FH prostate ca (uncle); DRE normal, check a PSA --CCS: Never had a colonoscopy, no FH, new guidelines  are suggesting to start early, last year did not proceed w/  Hemoccult, new one provided . --Diet and exercise --Labs: CMP, FLP, PSA

## 2017-10-29 NOTE — Patient Instructions (Addendum)
  GO TO THE FRONT DESK Schedule labs to be done fasting next week  Schedule your next appointment for a physical exam in 1 year  For the mucus in your throat: Flonase 2 sprays every morning on each side of the nose Astelin: 2 sprays every morning and at bedtime on each side of the nose Call if not gradually better.  Check the  blood pressure 2 or 3 times a month  Be sure your blood pressure is between 110/65 and  135/85. If it is consistently higher or lower, let me know

## 2017-10-30 NOTE — Assessment & Plan Note (Signed)
HTN: Controlled, continue lisinopril. Hyperlipidemia: Diet controlled, checking labs Paresthesias, headaches: See last visit, patient is now asymptomatic and declined further evaluation. Allergies?  See review of systems, he has a lot of mucus in the throat, statistically most likely postnasal dripping or GERD.  Recommend to continue Flonase, add Astelin twice daily.  Patient declined PPIs.  If not better she will let me know.  ENT?. RTC 1 year

## 2017-11-01 ENCOUNTER — Other Ambulatory Visit: Payer: BLUE CROSS/BLUE SHIELD

## 2017-11-03 ENCOUNTER — Encounter: Payer: Self-pay | Admitting: Internal Medicine

## 2017-12-11 ENCOUNTER — Other Ambulatory Visit: Payer: Self-pay | Admitting: Internal Medicine

## 2017-12-14 ENCOUNTER — Other Ambulatory Visit: Payer: Self-pay | Admitting: Internal Medicine

## 2017-12-14 NOTE — Telephone Encounter (Signed)
Used to take 75 mg daily, I sent a prescription for 25 mg 3 tablets daily as requested.

## 2017-12-14 NOTE — Telephone Encounter (Signed)
Pt is requesting refill on ketoprofen.   Last OV: 10/29/2017 Last Fill: 10/19/2016 #30 and 1RF

## 2017-12-22 ENCOUNTER — Other Ambulatory Visit (INDEPENDENT_AMBULATORY_CARE_PROVIDER_SITE_OTHER): Payer: BLUE CROSS/BLUE SHIELD

## 2017-12-22 DIAGNOSIS — Z0000 Encounter for general adult medical examination without abnormal findings: Secondary | ICD-10-CM | POA: Diagnosis not present

## 2017-12-22 LAB — FECAL OCCULT BLOOD, IMMUNOCHEMICAL: Fecal Occult Bld: NEGATIVE

## 2017-12-24 ENCOUNTER — Other Ambulatory Visit (INDEPENDENT_AMBULATORY_CARE_PROVIDER_SITE_OTHER): Payer: BLUE CROSS/BLUE SHIELD

## 2017-12-24 DIAGNOSIS — Z0000 Encounter for general adult medical examination without abnormal findings: Secondary | ICD-10-CM

## 2017-12-24 LAB — PSA: PSA: 0.52 ng/mL (ref 0.10–4.00)

## 2017-12-24 LAB — LIPID PANEL
Cholesterol: 190 mg/dL (ref 0–200)
HDL: 38.5 mg/dL — ABNORMAL LOW (ref >39.00)
NonHDL: 151.09
Total CHOL/HDL Ratio: 5
Triglycerides: 292 mg/dL — ABNORMAL HIGH (ref 0.0–149.0)
VLDL: 58.4 mg/dL — ABNORMAL HIGH (ref 0.0–40.0)

## 2017-12-24 LAB — COMPREHENSIVE METABOLIC PANEL
ALT: 21 U/L (ref 0–53)
AST: 21 U/L (ref 0–37)
Albumin: 4.7 g/dL (ref 3.5–5.2)
Alkaline Phosphatase: 52 U/L (ref 39–117)
BUN: 18 mg/dL (ref 6–23)
CO2: 28 mEq/L (ref 19–32)
Calcium: 9.3 mg/dL (ref 8.4–10.5)
Chloride: 103 mEq/L (ref 96–112)
Creatinine, Ser: 1.12 mg/dL (ref 0.40–1.50)
GFR: 89.25 mL/min (ref >60.00)
Glucose, Bld: 106 mg/dL — ABNORMAL HIGH (ref 70–99)
Potassium: 3.8 mEq/L (ref 3.5–5.1)
Sodium: 139 mEq/L (ref 135–145)
Total Bilirubin: 1.1 mg/dL (ref 0.2–1.2)
Total Protein: 7.8 g/dL (ref 6.0–8.3)

## 2017-12-24 LAB — LDL CHOLESTEROL, DIRECT: Direct LDL: 82 mg/dL

## 2018-03-15 ENCOUNTER — Ambulatory Visit: Payer: BLUE CROSS/BLUE SHIELD | Admitting: Internal Medicine

## 2018-08-22 ENCOUNTER — Encounter: Payer: Self-pay | Admitting: Internal Medicine

## 2018-09-19 ENCOUNTER — Telehealth: Payer: Self-pay

## 2018-09-19 NOTE — Telephone Encounter (Signed)
PA initiated via Covermymeds; KEY: [id number]. PA approved.  Effective from 09/19/2018 through 09/17/2021

## 2018-09-20 ENCOUNTER — Telehealth: Payer: BLUE CROSS/BLUE SHIELD | Admitting: Physician Assistant

## 2018-09-20 ENCOUNTER — Encounter: Payer: Self-pay | Admitting: Internal Medicine

## 2018-09-20 DIAGNOSIS — J029 Acute pharyngitis, unspecified: Secondary | ICD-10-CM

## 2018-09-20 NOTE — Progress Notes (Addendum)
Based on what you shared with me, I feel your condition warrants further evaluation and I recommend that you be seen for a face to face office visit. Headache and neck stiffness could be a sign of a more serious condition, such as an abscess in the throat, or meningitis.    NOTE: If you entered your credit card information for this eVisit, you will not be charged. You may see a "hold" on your card for the $35 but that hold will drop off and you will not have a charge processed.  If you are having a true medical emergency please call 911.     For an urgent face to face visit, [place] has four urgent care centers for your convenience:   . [facility]    [phone]                  Get Driving Directions  [address], [place] [address] . 10 am to 8 pm Monday-Friday . 12 pm to 8 pm Saturday-Sunday   . [facility] at [place]                  Get Driving Directions  [address], [place], [place] [address] . 8 am to 8 pm Monday-Friday . 9 am to 6 pm Saturday . 11 am to 6 pm Sunday   . [facility] at [place]                  Get Driving Directions   [address] [place], [place] [address] . 8 am to 8 pm Monday-Friday . 8 am to 4 pm Saturday-Sunday    . [facility] at [place]                    Get Driving Directions  [phone]  [address], [place], [place] [address]  . Monday-Friday, 12 PM to 6 PM    Your e-visit answers were reviewed by a board certified advanced clinical practitioner to complete your personal care plan.  Thank you for using e-Visits.  Greater than 10 minutes, yet less than 20 minutes of time have been spent researching, coordinating, and implementing care for this patient today.

## 2018-09-21 ENCOUNTER — Encounter: Payer: Self-pay | Admitting: Internal Medicine

## 2018-09-22 ENCOUNTER — Other Ambulatory Visit: Payer: Self-pay

## 2018-09-22 ENCOUNTER — Encounter: Payer: Self-pay | Admitting: Internal Medicine

## 2018-09-22 ENCOUNTER — Ambulatory Visit (INDEPENDENT_AMBULATORY_CARE_PROVIDER_SITE_OTHER): Payer: BC Managed Care – PPO | Admitting: Internal Medicine

## 2018-09-22 DIAGNOSIS — G4453 Primary thunderclap headache: Secondary | ICD-10-CM

## 2018-09-22 DIAGNOSIS — R51 Headache: Secondary | ICD-10-CM

## 2018-09-22 DIAGNOSIS — R519 Headache, unspecified: Secondary | ICD-10-CM

## 2018-09-22 MED ORDER — KETOPROFEN 25 MG PO CAPS: 25.0000 mg | ORAL_CAPSULE | Freq: Three times a day (TID) | ORAL | 0 refills | Status: DC | PRN

## 2018-09-22 NOTE — Assessment & Plan Note (Signed)
Headaches: The patient is having headaches for the last 5 days, features of the headaches change by the day, he did have one intense headache associated with intercourse. Today he has a mild left headache, no neck stiffness and otherwise feels well. Plan: CT head stat, ketoprofen as needed, watch symptoms, if severe or persistent headache he will let me know or go to the ER. Neurology referral for further eval.

## 2018-09-22 NOTE — Progress Notes (Signed)
Subjective:    Patient ID: Marcus FreestoneStephen M Boileau, male    DOB: 06/06/1965, 53 y.o.   MRN: [id number]  DOS:  09/22/2018 Type of visit - description: Virtual Visit via Video Note  I connected with@   by a video enabled telemedicine application and verified that I am speaking with the correct person using two identifiers.   THIS ENCOUNTER IS A VIRTUAL VISIT DUE TO COVID-19 - PATIENT WAS NOT SEEN IN THE OFFICE. PATIENT HAS CONSENTED TO VIRTUAL VISIT / TELEMEDICINE VISIT   Location of patient: home  Location of provider: office  I discussed the limitations of evaluation and management by telemedicine and the availability of in person appointments. The patient expressed understanding and agreed to proceed.  History of Present Illness: Acute 5 days ago, developed mild but persisting headache associated with nasal congestion. The next day he felt better. 3 days ago, had a very intense, top of the head, bilateral headache that lasted 60 seconds at the time of intercourse immediately after the climax. 2 days ago he developed some sore throat and a mild headache when he bend over. Today, has very light left-sided headache.   BP Readings from Last 3 Encounters:  10/29/17 128/70  09/09/17 134/86  07/30/17 136/84   Review of Systems No fever chills No visual disturbances No tearing No diplopia, facial numbness, slurred speech. No neck stiffness No recent head injury No chest pain, difficulty breathing, no palpitations  Past Medical History:  Diagnosis Date  . Allergy   . Chest pain    (-) stress test 2008, 2010  . Headache   . Hyperlipemia   . Hypertension     Past Surgical History:  Procedure Laterality Date  . KNEE ARTHROSCOPY     L x 2    Social History   Socioeconomic History  . Marital status: Married    Spouse name: Not on file  . Number of children: 0  . Years of education: Not on file  . Highest education level: Not on file  Occupational History  . Occupation:  tax preparation   Social Needs  . Financial resource strain: Not on file  . Food insecurity    Worry: Not on file    Inability: Not on file  . Transportation needs    Medical: Not on file    Non-medical: Not on file  Tobacco Use  . Smoking status: Never Smoker  . Smokeless tobacco: Never Used  Substance and Sexual Activity  . Alcohol use: Yes    Comment: rare  . Drug use: No  . Sexual activity: Yes    Partners: Female    Birth control/protection: Surgical  Lifestyle  . Physical activity    Days per week: Not on file    Minutes per session: Not on file  . Stress: Not on file  Relationships  . Social Musicianconnections    Talks on phone: Not on file    Gets together: Not on file    Attends religious service: Not on file    Active member of club or organization: Not on file    Attends meetings of clubs or organizations: Not on file    Relationship status: Not on file  . Intimate partner violence    Fear of current or ex partner: Not on file    Emotionally abused: Not on file    Physically abused: Not on file    Forced sexual activity: Not on file  Other Topics Concern  . Not on  file  Social History Narrative  . Not on file      Allergies as of 09/22/2018   No Known Allergies     Medication List       Accurate as of September 22, 2018  9:59 AM. If you have any questions, ask your nurse or doctor.        aspirin 81 MG tablet Take 81 mg by mouth daily.   azelastine 0.1 % nasal spray Commonly known as: ASTELIN Place 2 sprays into both nostrils 2 (two) times daily.   fish oil-omega-3 fatty acids 1000 MG capsule Take 2 g by mouth daily.   GLUCOSAMINE CHONDROITIN JOINT PO Take 1,200 mg by mouth daily at 6 (six) AM.   Ketoprofen 25 MG Caps TAKE 3 CAPSULES BY MOUTH EVERY DAY AS NEEDED FOR MODERATE PAIN. ALWAYS TAKE WITH FOOD   lisinopril 20 MG tablet Commonly known as: ZESTRIL Take 1 tablet (20 mg total) by mouth daily.           Objective:   Physical Exam  There were no vitals taken for this visit. This is a virtual video visit, he is alert oriented x3, face is symmetric, speech is clear and fluent.  No distress    Assessment     Assessment HTN Hyperlipidemia Chest pain : (-) stress test 2008, 2010 Migraines   PLAN  Headaches: The patient is having headaches for the last 5 days, features of the headaches change by the day, he did have one intense headache associated with intercourse. Today he has a mild left headache, no neck stiffness and otherwise feels well. Plan: CT head stat, ketoprofen as needed, watch symptoms, if severe or persistent headache he will let me know or go to the ER. Neurology referral for further eval.     I discussed the assessment and treatment plan with the patient. The patient was provided an opportunity to ask questions and all were answered. The patient agreed with the plan and demonstrated an understanding of the instructions.   The patient was advised to call back or seek an in-person evaluation if the symptoms worsen or if the condition fails to improve as anticipated.

## 2018-09-23 ENCOUNTER — Telehealth: Payer: Self-pay | Admitting: Internal Medicine

## 2018-09-23 DIAGNOSIS — G4453 Primary thunderclap headache: Secondary | ICD-10-CM

## 2018-09-23 NOTE — Telephone Encounter (Signed)
I do not have access to [facility] records.  Recommend to stick with [place].

## 2018-09-23 NOTE — Telephone Encounter (Signed)
Drue Dun- can you help w/ this? I tried calling [facility] back but couldn't get in touch with Marcie Bal directly.

## 2018-09-23 NOTE — Telephone Encounter (Signed)
Please advise 

## 2018-09-23 NOTE — Telephone Encounter (Signed)
Marcie Bal calling from [facility] called and stated that the pt would like CT order sent to [facility] because it is cheaper. Please advise   [facility] [phone]

## 2018-09-27 NOTE — Telephone Encounter (Signed)
fwded to [facility]

## 2018-09-28 ENCOUNTER — Telehealth: Payer: Self-pay

## 2018-09-28 NOTE — Addendum Note (Signed)
Addended byDamita Dunnings D on: 09/28/2018 08:26 AM   Modules accepted: Orders

## 2018-09-28 NOTE — Telephone Encounter (Signed)
Ketoprofen 25mg  capsules on long term back order. Please advise.

## 2018-09-29 MED ORDER — INDOMETHACIN 50 MG PO CAPS: 50.0000 mg | ORAL_CAPSULE | Freq: Three times a day (TID) | ORAL | 0 refills | Status: DC | PRN

## 2018-09-29 NOTE — Telephone Encounter (Signed)
Pt informed via [place].

## 2018-09-29 NOTE — Telephone Encounter (Signed)
Advise patient to switch to Indocin, take as needed, always with food to prevent gastritis or ulcers

## 2018-10-05 ENCOUNTER — Telehealth: Payer: Self-pay | Admitting: Internal Medicine

## 2018-10-05 NOTE — Telephone Encounter (Signed)
LM for ancillary processor to call me back and find out if pt was contacted/came in for STAT CT Head w/o Contrast ordered 09/28/2018.

## 2018-10-05 NOTE — Telephone Encounter (Signed)
-----   Message from [place], [place] sent at 10/05/2018 12:26 PM EDT ----- Regarding: CT head w/o Drue Dun- can you call Romelle Starcher and see if this CT head w/o was ever scheduled or done please?   [place]

## 2018-10-06 NOTE — Telephone Encounter (Signed)
Faxed STAT CT order to attn: Shauna & called and LM on her VM to notify of STAT order and to call if not received.

## 2018-10-06 NOTE — Telephone Encounter (Signed)
[facility] and was advised to leave another msg for Paula Libra, who completed ancillary orders, and to refax order to [phone]. Msg to [facility] to determine next steps.

## 2018-10-07 NOTE — Telephone Encounter (Signed)
Spoke to [place] and pt is scheduled for STAT CT 9/19 12:30pm at [facility]. Provided Dr. Ethel Rana cell for report.

## 2018-10-07 NOTE — Telephone Encounter (Signed)
Noted! Thank you

## 2018-10-08 ENCOUNTER — Encounter: Payer: Self-pay | Admitting: Internal Medicine

## 2018-10-10 ENCOUNTER — Telehealth: Payer: Self-pay | Admitting: Internal Medicine

## 2018-10-10 NOTE — Telephone Encounter (Signed)
Pt informed of results via MyChart. Results sent for scanning.

## 2018-10-10 NOTE — Telephone Encounter (Signed)
Advise patient: CT head performed October 08, 2018: Normal.  Good results.

## 2018-11-07 ENCOUNTER — Ambulatory Visit: Payer: BC Managed Care – PPO | Admitting: Neurology

## 2018-11-07 ENCOUNTER — Other Ambulatory Visit: Payer: Self-pay

## 2018-11-07 ENCOUNTER — Encounter: Payer: Self-pay | Admitting: Neurology

## 2018-11-07 VITALS — BP 178/107 | HR 88 | Temp 97.8°F | Ht 70.0 in | Wt 216.0 lb

## 2018-11-07 DIAGNOSIS — I10 Essential (primary) hypertension: Secondary | ICD-10-CM | POA: Diagnosis not present

## 2018-11-07 DIAGNOSIS — G4453 Primary thunderclap headache: Secondary | ICD-10-CM | POA: Diagnosis not present

## 2018-11-07 DIAGNOSIS — G4482 Headache associated with sexual activity: Secondary | ICD-10-CM

## 2018-11-07 DIAGNOSIS — G43001 Migraine without aura, not intractable, with status migrainosus: Secondary | ICD-10-CM | POA: Diagnosis not present

## 2018-11-07 DIAGNOSIS — Z0000 Encounter for general adult medical examination without abnormal findings: Secondary | ICD-10-CM

## 2018-11-07 DIAGNOSIS — Z09 Encounter for follow-up examination after completed treatment for conditions other than malignant neoplasm: Secondary | ICD-10-CM

## 2018-11-07 DIAGNOSIS — Z9289 Personal history of other medical treatment: Secondary | ICD-10-CM | POA: Insufficient documentation

## 2018-11-07 MED ORDER — ALPRAZOLAM 0.5 MG PO TABS: 0.5000 mg | ORAL_TABLET | Freq: Every evening | ORAL | 0 refills | Status: DC | PRN

## 2018-11-07 NOTE — Progress Notes (Signed)
Provider:  Melvyn Novas, M D  Referring Provider: Wanda Plump, MD Primary Care Physician:  Wanda Plump, MD  Chief Complaint  Patient presents with  . New Patient (Initial Visit)    pt alone, rm 10, states that he has a history of migraines, his last 2 that were considered severe, one was in sept 2020 and the one prior was may 2019. he doesnt take a daily preventative because doesnt feel like they happen as frequent. does complain having headaches     HPI:  Marcus Santiago is a 53 y.o. male  Is seen here as a referral/ revisit  from Dr. Drue Novel for headache evaluation:  Mr Marcus Santiago has a history of migraines that mainly divide into two types. One is a severe migraine headache  With visual aura, nausea and vomiting, photophobia. This version is rare- one in may 2019 and one September 2020. He had one severe headache after intercourse.  Lasting 4 days. The milder form is more common.  1-2 /month/ without aura,  nausea, only a pressure in his nose.   These headaches have begun to wake him up from sleep. Sometimes wakes up with headaches but not by headaches. About 4 migraines a month. Indomethacin has been prescribed by Dr. Drue Novel and helps with the lesser form of migraines.   CT of the head was negative, also this was a thunderclap headache.  No preventive medication, only trigger- avoidance. Relpax was too expensive. He can easily go to sleep. He yawns a lo with headaches.    Review of Systems: Out of a complete 14 system review, the patient complains of only the following symptoms, and all other reviewed systems are negative.   He has big toe numbness. No injury.    Social History   Socioeconomic History  . Marital status: Married    Spouse name: Not on file  . Number of children: 0  . Years of education: Not on file  . Highest education level: Not on file  Occupational History  . Occupation: tax preparation   Social Needs  . Financial resource strain: Not on file  . Food  insecurity    Worry: Not on file    Inability: Not on file  . Transportation needs    Medical: Not on file    Non-medical: Not on file  Tobacco Use  . Smoking status: Never Smoker  . Smokeless tobacco: Never Used  Substance and Sexual Activity  . Alcohol use: Yes    Comment: rare  . Drug use: No  . Sexual activity: Yes    Partners: Female    Birth control/protection: Surgical  Lifestyle  . Physical activity    Days per week: Not on file    Minutes per session: Not on file  . Stress: Not on file  Relationships  . Social Musician on phone: Not on file    Gets together: Not on file    Attends religious service: Not on file    Active member of club or organization: Not on file    Attends meetings of clubs or organizations: Not on file    Relationship status: Not on file  . Intimate partner violence    Fear of current or ex partner: Not on file    Emotionally abused: Not on file    Physically abused: Not on file    Forced sexual activity: Not on file  Other Topics Concern  . Not on  file  Social History Narrative  . Not on file    Family History  Problem Relation Age of Onset  . Prostate cancer Other        uncle  . CAD Father        F first MI ~ 150, GF, uncle   . Heart disease Father   . Hyperlipidemia Father   . Hypertension Father   . Stroke Other        GF  . Lupus Mother   . Diabetes Paternal Grandmother   . Heart disease Paternal Grandfather   . Stroke Paternal Grandfather   . Colon cancer Neg Hx     Past Medical History:  Diagnosis Date  . Allergy   . Chest pain    (-) stress test 2008, 2010  . Headache   . Hyperlipemia   . Hypertension     Past Surgical History:  Procedure Laterality Date  . KNEE ARTHROSCOPY     L x 2    Current Outpatient Medications  Medication Sig Dispense Refill  . aspirin 81 MG tablet Take 81 mg by mouth daily.    Marland Kitchen. azelastine (ASTELIN) 0.1 % nasal spray Place 2 sprays into both nostrils 2 (two) times  daily. 30 mL 6  . fish oil-omega-3 fatty acids 1000 MG capsule Take 2 g by mouth daily.    . Glucos-Chondroit-Hyaluron-MSM (GLUCOSAMINE CHONDROITIN JOINT PO) Take 1,200 mg by mouth daily at 6 (six) AM.    . indomethacin (INDOCIN) 50 MG capsule Take 1 capsule (50 mg total) by mouth 3 (three) times daily with meals as needed (HEADACHES). 30 capsule 0  . lisinopril (PRINIVIL,ZESTRIL) 20 MG tablet Take 1 tablet (20 mg total) by mouth daily. 90 tablet 3   No current facility-administered medications for this visit.     Allergies as of 11/07/2018  . (No Known Allergies)    Vitals: BP (!) 178/107   Pulse 88   Temp 97.8 F (36.6 C)   Ht 5\' 10"  (1.778 m)   Wt 216 lb (98 kg)   BMI 30.99 kg/m  Last Weight:  Wt Readings from Last 1 Encounters:  11/07/18 216 lb (98 kg)   Last Height:   Ht Readings from Last 1 Encounters:  11/07/18 5\' 10"  (1.778 m)    Physical exam:  General: The patient is awake, alert and appears not in acute distress.  The patient is well groomed. Head: Normocephalic, atraumatic. Neck is supple. Cardiovascular:  Regular rate and rhythm , without  murmurs or carotid bruit, and without distended neck veins. Respiratory: Lungs are clear to auscultation. Skin:  Without evidence of edema, or rash Trunk: BMI is 30, normal  posture.  Neurologic exam : The patient is awake and alert, oriented to place and time.  Memory subjective described as intact. There is a normal attention span & concentration ability. Speech is fluent without  dysarthria, dysphonia or aphasia. Mood and affect are appropriate.  Cranial nerves: Pupils are equal and briskly reactive to light.  Funduscopic exam without evidence of pallor or edema. Extraocular movements  in vertical and horizontal planes intact and without nystagmus.  Visual fields by finger perimetry are intact. Hearing to finger rub intact. Facial sensation intact to fine touch. Facial motor strength is symmetric and tongue and uvula  move midline. Tongue protrusion into either cheek is normal. Shoulder shrug is normal.   Motor exam: Normal tone ,muscle bulk and symmetric  strength in all extremities. Sensory:  Fine touch, pinprick and  vibration were tested in all extremities. Proprioception was normal. Coordination: Rapid alternating movements in the fingers/hands were normal. Finger-to-nose maneuver  normal without evidence of ataxia, dysmetria or tremor. Gait and station: Patient walks without assistive device and is able unassisted to climb up to the exam table. Strength within normal limits. Stance is stable and normal.  Tandem gait is unfragmented. Romberg testing is deferred.  Deep tendon reflexes: in the  upper and lower extremities are symmetric and intact. Babinski maneuver response is downgoing.   Assessment:  After physical and neurologic examination, review of laboratory studies, imaging, neurophysiology testing and pre-existing records, assessment is that of :   I had the pleasure of meeting Marcus Santiago today, a 53 year old left-handed gentleman, who considers himself ambidextrous.  He has experienced milder form of migraine for many years but finds now in May 2019 and in September of this year he had severe thunderclap-like headaches that lasted multiple days and very debilitating.  The event in September was a postcoital headache.  It was followed by a CT scan unenhanced which showed no abnormalities.  The patient had rectal relief from his regular less severe migraines by using Ketoprofen and later diclofenac.There has been no medication helping with the severity intensity quality and duration of the September event.    He also reports that he developed a numb hallux of the right foot and sometimes wakes up with numbness in both upper extremities radiating to his fingers.  This seems to be related to the position of the neck and shoulder.  Plan:  Treatment plan and additional workup :   I would like  to obtain an MRI of the brain my goal is to look at vascular abnormalities with an MRA a magnetic resonance generated image of the brain vessels this can help to rule out aneurysms.  We know that the patient had no bleed due to his CT results, however a malformation that has not yet bled but not have been detected.  This was also helpful to determine if the patient should return to taking a triptan such as Imitrex, Maxalt or Frova in the future. I  also discussed to start a preventive, but the patient feels his frequency does not require this.   Rv with NP in 2 month.    Asencion Partridge Sanika Brosious MD 11/07/2018

## 2018-11-07 NOTE — Patient Instructions (Signed)
Recurrent Migraine Headache  Migraines are a type of headache, and they are usually stronger and more sudden than normal headaches (tension headaches). Migraines are characterized by an intense pulsing, throbbing pain that is usually only present on one side of the head. Sometimes, migraine headaches can cause nausea, vomiting, sensitivity to light and sound, and vision changes. Recurrent migraines keep coming back (recurring). A migraine can last from 4 hours up to 3 days. What are the causes? The exact cause of this condition is not known. However, a migraine may be caused when nerves in the brain become irritated and release chemicals that cause inflammation of blood vessels. This inflammation causes pain. Certain things may also trigger migraines, such as:  A disruption in your regular eating and sleeping schedule.  Smoking.  Stress.  Menstruation.  Certain foods and drinks, such as: ? Aged cheese. ? Chocolate. ? Alcohol. ? Caffeine. ? Foods or drinks that contain nitrates, glutamate, aspartame, MSG, or tyramine.  Lack of sleep.  Hunger.  Physical exertion.  Fatigue.  High altitude.  Weather changes.  Medicines, such as: ? Nitroglycerin, which is used to treat chest pain. ? Birth control pills. ? Estrogen. ? Some blood pressure medicines. What are the signs or symptoms? Symptoms of this condition vary for each person and may include:  Pain that is usually only present on one side of the head. In some cases, the pain may be on both sides of the head or around the head or neck.  Pulsating or throbbing pain.  Severe pain that prevents daily activities.  Pain that is aggravated by any physical activity.  Nausea, vomiting, or both.  Dizziness.  Pain with exposure to bright lights, loud noises, or activity.  General sensitivity to bright lights, loud noises, or smells. Before you get a migraine, you may get warning signs that a migraine is coming (aura). An aura  may include:  Seeing flashing lights.  Seeing bright spots, halos, or zigzag lines.  Having tunnel vision or blurred vision.  Having numbness or a tingling feeling.  Having trouble talking.  Having muscle weakness.  Smelling a certain odor. How is this diagnosed? This condition is often diagnosed based on:  Your symptoms and medical history.  A physical exam. You may also have tests, including:  A CT scan or MRI of your brain. These imaging tests cannot diagnose migraines, but they can help to rule out other causes of headaches.  Blood tests. How is this treated? This condition is treated with:  Medicines. These are used for: ? Lessening pain and nausea. ? Preventing recurrent migraines.  Lifestyle changes, such as changes to your diet or sleeping patterns.  Behavior therapy, such as relaxation training or biofeedback. Biofeedback is a treatment that involves teaching you to relax and use your brain to lower your heart rate and control your breathing. Follow these instructions at home: Medicines  Take over-the-counter and prescription medicines only as told by your health care provider.  Do not drive or use heavy machinery while taking prescription pain medicine. Lifestyle  Do not use any products that contain nicotine or tobacco, such as cigarettes and e-cigarettes. If you need help quitting, ask your health care provider.  Limit alcohol intake to no more than 1 drink a day for nonpregnant women and 2 drinks a day for men. One drink equals 12 oz of beer, 5 oz of wine, or 1 oz of hard liquor.  Get 7-9 hours of sleep each night, or the amount of   sleep recommended by your health care provider.  Limit your stress. Talk with your health care provider if you need help with stress management.  Maintain a healthy weight. If you need help losing weight, ask your health care provider.  Exercise regularly. Aim for 150 minutes of moderate-intensity exercise (walking,  biking, yoga) or 75 minutes of vigorous exercise (running, circuit training, swimming) each week. General instructions   Keep a journal to find out what triggers your migraine headaches so you can avoid these triggers. For example, write down: ? What you eat and drink. ? How much sleep you get. ? Any change to your diet or medicines.  Lie down in a dark, quiet room when you have a migraine.  Try placing a cool towel over your head when you have a migraine.  Keep lights dim, if bright lights bother you and make your migraines worse.  Keep all follow-up visits as told by your health care provider. This is important. Contact a health care provider if:  Your pain does not improve, even with medicine.  Your migraines continue to return, even with medicine.  You have a fever.  You have weight loss. Get help right away if:  Your migraine becomes severe and medicine does not help.  You have a stiff neck.  You have a loss of vision.  You have muscle weakness or loss of muscle control.  You start losing your balance or have trouble walking.  You feel faint or you pass out.  You develop new, severe symptoms.  You start having abrupt severe headaches that last for a second or less, like a thunderclap. Summary  Migraine headaches are usually stronger and more sudden than normal headaches (tension headaches). Migraines are characterized by an intense pulsing, throbbing pain that is usually only present on one side of the head.  The exact cause of this condition is not known. However, a migraine may be caused when nerves in the brain become irritated and release chemicals that cause inflammation of blood vessels.  Certain things may trigger migraines, such as changes to diet or sleeping patterns, smoking, certain foods, alcohol, stress, and certain medicines.  Sometimes, migraine headaches can cause nausea, vomiting, sensitivity to light and sound, and vision changes.  Migraines  are often diagnosed based on your symptoms, medical history, and a physical exam. This information is not intended to replace advice given to you by your health care provider. Make sure you discuss any questions you have with your health care provider. Document Released: 09/30/2000 Document Revised: 01/08/2017 Document Reviewed: 10/18/2015 Elsevier Patient Education  2020 Elsevier Inc.  

## 2018-11-08 ENCOUNTER — Encounter: Payer: Self-pay | Admitting: Neurology

## 2018-11-08 ENCOUNTER — Other Ambulatory Visit: Payer: Self-pay | Admitting: Neurology

## 2018-11-08 LAB — COMPREHENSIVE METABOLIC PANEL
ALT: 28 IU/L (ref 0–44)
AST: 27 IU/L (ref 0–40)
Albumin/Globulin Ratio: 1.7 (ref 1.2–2.2)
Albumin: 4.8 g/dL (ref 4.0–5.0)
Alkaline Phosphatase: 70 IU/L (ref 39–117)
BUN/Creatinine Ratio: 17 (ref 9–20)
BUN: 19 mg/dL (ref 6–24)
Bilirubin Total: 0.7 mg/dL (ref 0.0–1.2)
CO2: 25 mmol/L (ref 20–29)
Calcium: 9.3 mg/dL (ref 8.7–10.2)
Chloride: 102 mmol/L (ref 96–106)
Creatinine, Ser: 1.15 mg/dL (ref 0.76–1.27)
GFR calc Af Amer: 85 mL/min/{1.73_m2} (ref >59)
GFR calc non Af Amer: 74 mL/min/{1.73_m2} (ref >59)
Globulin, Total: 2.9 g/dL (ref 1.5–4.5)
Glucose: 103 mg/dL — ABNORMAL HIGH (ref 65–99)
Potassium: 4 mmol/L (ref 3.5–5.2)
Sodium: 144 mmol/L (ref 134–144)
Total Protein: 7.7 g/dL (ref 6.0–8.5)

## 2018-11-08 LAB — CBC
Hematocrit: 47.7 % (ref 37.5–51.0)
Hemoglobin: 16.4 g/dL (ref 13.0–17.7)
MCH: 28 pg (ref 26.6–33.0)
MCHC: 34.4 g/dL (ref 31.5–35.7)
MCV: 82 fL (ref 79–97)
Platelets: 295 10*3/uL (ref 150–450)
RBC: 5.85 x10E6/uL — ABNORMAL HIGH (ref 4.14–5.80)
RDW: 13.8 % (ref 11.6–15.4)
WBC: 6.9 10*3/uL (ref 3.4–10.8)

## 2018-11-08 MED ORDER — ALPRAZOLAM 1 MG PO TABS: ORAL_TABLET | ORAL | 0 refills | Status: DC

## 2018-11-09 ENCOUNTER — Telehealth: Payer: Self-pay | Admitting: Neurology

## 2018-11-09 NOTE — Telephone Encounter (Signed)
Called the patient and reviewed the lab work with him. Advised of the lab findings. Pt verbalized understanding. Pt had no questions at this time but was encouraged to call back if questions arise.

## 2018-11-09 NOTE — Telephone Encounter (Signed)
unable to leave vmail the mail box is not set up   Marcus Santiago: [phone] (exp. 11/09/18 to 05/07/19)

## 2018-11-09 NOTE — Telephone Encounter (Signed)
-----   Message from Larey Seat, MD sent at 11/08/2018  9:51 AM EDT ----- Non fasting Glucose elevation, slight elevation in overall RBC is new since last year.

## 2018-12-01 ENCOUNTER — Encounter: Payer: Self-pay | Admitting: Neurology

## 2018-12-06 ENCOUNTER — Ambulatory Visit: Payer: BC Managed Care – PPO

## 2018-12-06 ENCOUNTER — Other Ambulatory Visit: Payer: Self-pay

## 2019-02-16 ENCOUNTER — Other Ambulatory Visit: Payer: Self-pay | Admitting: Internal Medicine

## 2019-02-17 ENCOUNTER — Other Ambulatory Visit: Payer: Self-pay

## 2019-02-17 MED ORDER — INDOMETHACIN 50 MG PO CAPS: 50.0000 mg | ORAL_CAPSULE | Freq: Three times a day (TID) | ORAL | 0 refills | Status: DC | PRN

## 2019-03-20 DIAGNOSIS — Z20822 Contact with and (suspected) exposure to covid-19: Secondary | ICD-10-CM | POA: Diagnosis not present

## 2019-04-06 ENCOUNTER — Other Ambulatory Visit: Payer: BC Managed Care – PPO | Attending: Family

## 2019-04-06 DIAGNOSIS — Z23 Encounter for immunization: Secondary | ICD-10-CM | POA: Insufficient documentation

## 2019-04-06 NOTE — Progress Notes (Signed)
   Covid-19 Vaccination Clinic  Name:  Marcus Santiago    MRN: [id number] DOB: 28-Aug-1965  04/06/2019  Mr. Marcus Santiago was observed post Covid-19 immunization for 15 minutes without incident. He was provided with Vaccine Information Sheet and instruction to access the V-Safe system.   Mr. Marcus Santiago was instructed to call 911 with any severe reactions post vaccine: Marland Kitchen Difficulty breathing  . Swelling of face and throat  . A fast heartbeat  . A bad rash all over body  . Dizziness and weakness   Immunizations Administered    Name Date Dose VIS Date Route   Moderna COVID-19 Vaccine 04/06/2019 10:10 AM 0.5 mL 12/20/2018 Intramuscular   Manufacturer: Moderna   Lot: [id number]   NDC: 53646-803-21

## 2019-05-09 ENCOUNTER — Ambulatory Visit: Payer: BC Managed Care – PPO | Attending: Family

## 2019-05-09 DIAGNOSIS — Z23 Encounter for immunization: Secondary | ICD-10-CM

## 2019-05-09 NOTE — Progress Notes (Signed)
   Covid-19 Vaccination Clinic  Name:  SHAWNDALE KILPATRICK    MRN: [id number] DOB: 05-28-1965  05/09/2019  [name] was observed post Covid-19 immunization for 15 minutes without incident. He was provided with Vaccine Information Sheet and instruction to access the V-Safe system.   [name] was instructed to call 911 with any severe reactions post vaccine: Marland Kitchen Difficulty breathing  . Swelling of face and throat  . A fast heartbeat  . A bad rash all over body  . Dizziness and weakness   Immunizations Administered    Name Date Dose VIS Date Route   Moderna COVID-19 Vaccine 05/09/2019 10:03 AM 0.5 mL 12/2018 Intramuscular   Manufacturer: Moderna   Lot: [id number]   NDC: 35686-168-37

## 2019-05-15 ENCOUNTER — Other Ambulatory Visit: Payer: Self-pay | Admitting: Internal Medicine

## 2019-06-15 ENCOUNTER — Encounter: Payer: Self-pay | Admitting: Internal Medicine

## 2019-06-15 DIAGNOSIS — Z0000 Encounter for general adult medical examination without abnormal findings: Secondary | ICD-10-CM

## 2019-06-27 ENCOUNTER — Other Ambulatory Visit: Payer: Self-pay | Admitting: Internal Medicine

## 2019-06-27 ENCOUNTER — Telehealth: Payer: Self-pay | Admitting: Internal Medicine

## 2019-06-27 NOTE — Telephone Encounter (Signed)
Pt called for lab appt unable to schedule a morning for this week. Offered patient Marcus Santiago but declined

## 2019-06-28 ENCOUNTER — Other Ambulatory Visit (INDEPENDENT_AMBULATORY_CARE_PROVIDER_SITE_OTHER): Payer: BC Managed Care – PPO

## 2019-06-28 ENCOUNTER — Other Ambulatory Visit: Payer: BC Managed Care – PPO

## 2019-06-28 DIAGNOSIS — Z0000 Encounter for general adult medical examination without abnormal findings: Secondary | ICD-10-CM | POA: Diagnosis not present

## 2019-06-28 LAB — CBC WITH DIFFERENTIAL/PLATELET
Basophils Absolute: 0 10*3/uL (ref 0.0–0.1)
Basophils Relative: 0.1 % (ref 0.0–3.0)
Eosinophils Absolute: 0 10*3/uL (ref 0.0–0.7)
Eosinophils Relative: 0.8 % (ref 0.0–5.0)
HCT: 46.3 % (ref 39.0–52.0)
Hemoglobin: 15.4 g/dL (ref 13.0–17.0)
Lymphocytes Relative: 28.7 % (ref 12.0–46.0)
Lymphs Abs: 1.5 10*3/uL (ref 0.7–4.0)
MCHC: 33.2 g/dL (ref 30.0–36.0)
MCV: 84.5 fl (ref 78.0–100.0)
Monocytes Absolute: 0.4 10*3/uL (ref 0.1–1.0)
Monocytes Relative: 7 % (ref 3.0–12.0)
Neutro Abs: 3.4 10*3/uL (ref 1.4–7.7)
Neutrophils Relative %: 63.4 % (ref 43.0–77.0)
Platelets: 297 10*3/uL (ref 150.0–400.0)
RBC: 5.48 Mil/uL (ref 4.22–5.81)
RDW: 13.8 % (ref 11.5–15.5)
WBC: 5.4 10*3/uL (ref 4.0–10.5)

## 2019-06-28 LAB — LIPID PANEL
Cholesterol: 224 mg/dL — ABNORMAL HIGH (ref 0–200)
HDL: 40.3 mg/dL (ref >39.00)
LDL Cholesterol: 146 mg/dL — ABNORMAL HIGH (ref 0–99)
NonHDL: 183.31
Total CHOL/HDL Ratio: 6
Triglycerides: 185 mg/dL — ABNORMAL HIGH (ref 0.0–149.0)
VLDL: 37 mg/dL (ref 0.0–40.0)

## 2019-06-28 LAB — COMPREHENSIVE METABOLIC PANEL
ALT: 16 U/L (ref 0–53)
AST: 19 U/L (ref 0–37)
Albumin: 4.8 g/dL (ref 3.5–5.2)
Alkaline Phosphatase: 49 U/L (ref 39–117)
BUN: 23 mg/dL (ref 6–23)
CO2: 27 mEq/L (ref 19–32)
Calcium: 9.6 mg/dL (ref 8.4–10.5)
Chloride: 104 mEq/L (ref 96–112)
Creatinine, Ser: 1.04 mg/dL (ref 0.40–1.50)
GFR: 90.92 mL/min (ref >60.00)
Glucose, Bld: 106 mg/dL — ABNORMAL HIGH (ref 70–99)
Potassium: 4.1 mEq/L (ref 3.5–5.1)
Sodium: 137 mEq/L (ref 135–145)
Total Bilirubin: 1 mg/dL (ref 0.2–1.2)
Total Protein: 7.6 g/dL (ref 6.0–8.3)

## 2019-06-28 LAB — PSA: PSA: 0.4 ng/mL (ref 0.10–4.00)

## 2019-06-28 LAB — HEMOGLOBIN A1C: Hgb A1c MFr Bld: 5.8 % (ref 4.6–6.5)

## 2019-07-05 ENCOUNTER — Encounter: Payer: BC Managed Care – PPO | Admitting: Internal Medicine

## 2019-07-06 ENCOUNTER — Encounter: Payer: Self-pay | Admitting: Internal Medicine

## 2019-07-11 ENCOUNTER — Other Ambulatory Visit: Payer: Self-pay

## 2019-07-11 ENCOUNTER — Encounter: Payer: Self-pay | Admitting: Internal Medicine

## 2019-07-11 ENCOUNTER — Telehealth: Payer: Self-pay

## 2019-07-11 ENCOUNTER — Ambulatory Visit (INDEPENDENT_AMBULATORY_CARE_PROVIDER_SITE_OTHER): Payer: BC Managed Care – PPO | Admitting: Internal Medicine

## 2019-07-11 VITALS — BP 129/96 | HR 63 | Temp 97.2°F | Resp 18 | Ht 70.0 in | Wt 206.1 lb

## 2019-07-11 DIAGNOSIS — E785 Hyperlipidemia, unspecified: Secondary | ICD-10-CM

## 2019-07-11 DIAGNOSIS — G4453 Primary thunderclap headache: Secondary | ICD-10-CM | POA: Diagnosis not present

## 2019-07-11 DIAGNOSIS — Z0000 Encounter for general adult medical examination without abnormal findings: Secondary | ICD-10-CM | POA: Diagnosis not present

## 2019-07-11 DIAGNOSIS — M542 Cervicalgia: Secondary | ICD-10-CM | POA: Diagnosis not present

## 2019-07-11 DIAGNOSIS — I10 Essential (primary) hypertension: Secondary | ICD-10-CM | POA: Diagnosis not present

## 2019-07-11 MED ORDER — ALPRAZOLAM 1 MG PO TABS: ORAL_TABLET | ORAL | 0 refills | Status: DC

## 2019-07-11 NOTE — Progress Notes (Signed)
Pre visit review using our clinic review tool, if applicable. No additional management support is needed unless otherwise documented below in the visit note. 

## 2019-07-11 NOTE — Patient Instructions (Signed)
Check the  blood pressure twice a month BP GOAL is between 110/65 and  135/85. If it is consistently higher or lower, let me know   We are referring you to a nutritionist  Also to a physical therapist  We will try to schedule a brain MRI/MRA   GO TO THE FRONT DESK, PLEASE SCHEDULE YOUR APPOINTMENTS Come back for a checkup in 6 months

## 2019-07-11 NOTE — Telephone Encounter (Signed)
Cologuard ordered

## 2019-07-11 NOTE — Progress Notes (Signed)
Subjective:    Patient ID: Marcus Santiago, male    DOB: [birth year], 54 y.o.   MRN: [id number]  DOS:  07/11/2019 Type of visit - description: CPX  In addition to CPX we discussed several issues: -Hypertension -Headaches: Has 2 type of headaches, a sudden, severe, headache that he calls migraines, + association with nausea.  He treats this particular headache with going to sleep which usually help. He also has slow dull mild headache that he takes Indocin for. Neck pain: Going on for a while, worse with certain movements, does not radiate. He is concerned about his cholesterol.   Review of Systems  Other than above, a 14 point review of systems is negative    Past Medical History:  Diagnosis Date  . Allergy   . Chest pain    (-) stress test 2008, 2010  . Headache   . Hyperlipemia   . Hypertension     Past Surgical History:  Procedure Laterality Date  . KNEE ARTHROSCOPY     L x 2    Allergies as of 07/11/2019   No Known Allergies     Medication List       Accurate as of July 11, 2019  1:38 PM. If you have any questions, ask your nurse or doctor.        ALPRAZolam 1 MG tablet Commonly known as: XANAX Take 1/2 tablet 30-45 min prior to MRI, May take the other 1/2 tablet at the time of procedure if needed.   aspirin 81 MG tablet Take 81 mg by mouth daily.   azelastine 0.1 % nasal spray Commonly known as: ASTELIN Place 2 sprays into both nostrils 2 (two) times daily.   fish oil-omega-3 fatty acids 1000 MG capsule Take 2 g by mouth daily.   GLUCOSAMINE CHONDROITIN JOINT PO Take 1,200 mg by mouth daily at 6 (six) AM.   indomethacin 50 MG capsule Commonly known as: INDOCIN Take 1 capsule (50 mg total) by mouth 3 (three) times daily with meals as needed (HEADACHES).   lisinopril 20 MG tablet Commonly known as: ZESTRIL Take 1 tablet (20 mg total) by mouth daily.          Objective:   Physical Exam BP (!) 129/96 (BP Location: Left Arm, Patient  Position: Sitting, Cuff Size: Normal)   Pulse 63   Temp (!) 97.2 F (36.2 C) (Temporal)   Resp 18   Ht 5\' 10"  (1.778 m)   Wt 206 lb 2 oz (93.5 kg)   SpO2 98%   BMI 29.58 kg/m  General:   Well developed, NAD, BMI noted. HEENT:  Normocephalic . Face symmetric, atraumatic Lungs:  CTA B Normal respiratory effort, no intercostal retractions, no accessory muscle use. Heart: RRR,  no murmur.  Lower extremities: no pretibial edema bilaterally  Skin: Not pale. Not jaundice Neurologic:  alert & oriented X3.  Speech normal, gait appropriate for age and unassisted Psych--  Cognition and judgment appear intact.  Cooperative with normal attention span and concentration.  Behavior appropriate. No anxious or depressed appearing.      Assessment     Assessment HTN Hyperlipidemia Chest pain : (-) stress test 2008, 2010 Migraines   PLAN  Here for CPX, in addition we discussed: HTN: On lisinopril, no recent ambulatory BPs, diastolic BP slightly elevated today, no change, monitor BPs Hyperlipidemia: Doing great with diet and exercise yet his LDL has increased, he is doing a keto diet and request further dietary advice.  Refer to  a dietitian.  CV RF at 10 years is 10.3%.  Consider statins in the future. Headaches: Seen on 09/22/2018, CT head normal, saw neurology 10-2018, due to history consistent with a thunderclap-like headache, Rx MRI, MRA and after that possibly prescribed a triptan. He could not tolerate the MRI, it was supposed to be rescheduled but never happened.  We will try to obtain the MRI MRA.  Rx for Xanax for claustrophobia . Neck pain: With no radiation, persisted I recommend is physical therapy.  Will arrange RTC 6 months  In addition to CPX, multiple other issues were discussed, see above.   this visit occurred during the SARS-CoV-2 public health emergency.  Safety protocols were in place, including screening questions prior to the visit, additional usage of staff PPE,  and extensive cleaning of exam room while observing appropriate contact time as indicated for disinfecting solutions.

## 2019-07-12 ENCOUNTER — Encounter: Payer: Self-pay | Admitting: Internal Medicine

## 2019-07-12 NOTE — Assessment & Plan Note (Signed)
Here for CPX, in addition we discussed: HTN: On lisinopril, no recent ambulatory BPs, diastolic BP slightly elevated today, no change, monitor BPs Hyperlipidemia: Doing great with diet and exercise yet his LDL has increased, he is doing a keto diet and request further dietary advice.  Refer to a dietitian.  CV RF at 10 years is 10.3%.  Consider statins in the future. Headaches: Seen on 09/22/2018, CT head normal, saw neurology 10-2018, due to history consistent with a thunderclap-like headache, Rx MRI, MRA and after that possibly prescribed a triptan. He could not tolerate the MRI, it was supposed to be rescheduled but never happened.  We will try to obtain the MRI MRA.  Rx for Xanax for claustrophobia . Neck pain: With no radiation, persisted I recommend is physical therapy.  Will arrange RTC 6 months

## 2019-07-12 NOTE — Assessment & Plan Note (Signed)
--  Td 2018 - s/p covid vaccines  - flu shot recommended for the fall -- + FH prostate ca (uncle);  PSA few days ago normal.   --CCS: Never had a colonoscopy, no FH, (-) IFOB 12/2017.  3 options discussed, elected Cologuard --Diet and exercise: Doing great --Labs from few days ago reviewed

## 2019-07-17 DIAGNOSIS — M6281 Muscle weakness (generalized): Secondary | ICD-10-CM | POA: Diagnosis not present

## 2019-07-17 DIAGNOSIS — M542 Cervicalgia: Secondary | ICD-10-CM | POA: Diagnosis not present

## 2019-07-17 DIAGNOSIS — M799 Soft tissue disorder, unspecified: Secondary | ICD-10-CM | POA: Diagnosis not present

## 2019-07-17 DIAGNOSIS — M7542 Impingement syndrome of left shoulder: Secondary | ICD-10-CM | POA: Diagnosis not present

## 2019-07-19 ENCOUNTER — Encounter: Payer: Self-pay | Admitting: Internal Medicine

## 2019-07-21 DIAGNOSIS — M799 Soft tissue disorder, unspecified: Secondary | ICD-10-CM | POA: Diagnosis not present

## 2019-07-21 DIAGNOSIS — M7542 Impingement syndrome of left shoulder: Secondary | ICD-10-CM | POA: Diagnosis not present

## 2019-07-21 DIAGNOSIS — M542 Cervicalgia: Secondary | ICD-10-CM | POA: Diagnosis not present

## 2019-07-21 DIAGNOSIS — M6281 Muscle weakness (generalized): Secondary | ICD-10-CM | POA: Diagnosis not present

## 2019-07-24 ENCOUNTER — Other Ambulatory Visit: Payer: Self-pay | Admitting: Internal Medicine

## 2019-07-25 DIAGNOSIS — M542 Cervicalgia: Secondary | ICD-10-CM | POA: Diagnosis not present

## 2019-07-25 DIAGNOSIS — M7542 Impingement syndrome of left shoulder: Secondary | ICD-10-CM | POA: Diagnosis not present

## 2019-07-25 DIAGNOSIS — M799 Soft tissue disorder, unspecified: Secondary | ICD-10-CM | POA: Diagnosis not present

## 2019-07-25 DIAGNOSIS — M6281 Muscle weakness (generalized): Secondary | ICD-10-CM | POA: Diagnosis not present

## 2019-08-07 DIAGNOSIS — M7542 Impingement syndrome of left shoulder: Secondary | ICD-10-CM | POA: Diagnosis not present

## 2019-08-07 DIAGNOSIS — M6281 Muscle weakness (generalized): Secondary | ICD-10-CM | POA: Diagnosis not present

## 2019-08-07 DIAGNOSIS — M799 Soft tissue disorder, unspecified: Secondary | ICD-10-CM | POA: Diagnosis not present

## 2019-08-07 DIAGNOSIS — M542 Cervicalgia: Secondary | ICD-10-CM | POA: Diagnosis not present

## 2019-08-09 DIAGNOSIS — M542 Cervicalgia: Secondary | ICD-10-CM | POA: Diagnosis not present

## 2019-08-09 DIAGNOSIS — M6281 Muscle weakness (generalized): Secondary | ICD-10-CM | POA: Diagnosis not present

## 2019-08-09 DIAGNOSIS — M7542 Impingement syndrome of left shoulder: Secondary | ICD-10-CM | POA: Diagnosis not present

## 2019-08-09 DIAGNOSIS — M799 Soft tissue disorder, unspecified: Secondary | ICD-10-CM | POA: Diagnosis not present

## 2019-08-16 DIAGNOSIS — M542 Cervicalgia: Secondary | ICD-10-CM | POA: Diagnosis not present

## 2019-08-16 DIAGNOSIS — M6281 Muscle weakness (generalized): Secondary | ICD-10-CM | POA: Diagnosis not present

## 2019-08-16 DIAGNOSIS — M799 Soft tissue disorder, unspecified: Secondary | ICD-10-CM | POA: Diagnosis not present

## 2019-08-16 DIAGNOSIS — M7542 Impingement syndrome of left shoulder: Secondary | ICD-10-CM | POA: Diagnosis not present

## 2019-08-17 DIAGNOSIS — M6281 Muscle weakness (generalized): Secondary | ICD-10-CM | POA: Diagnosis not present

## 2019-08-17 DIAGNOSIS — M542 Cervicalgia: Secondary | ICD-10-CM | POA: Diagnosis not present

## 2019-08-17 DIAGNOSIS — M7542 Impingement syndrome of left shoulder: Secondary | ICD-10-CM | POA: Diagnosis not present

## 2019-08-17 DIAGNOSIS — M799 Soft tissue disorder, unspecified: Secondary | ICD-10-CM | POA: Diagnosis not present

## 2019-08-19 ENCOUNTER — Other Ambulatory Visit: Payer: BC Managed Care – PPO

## 2019-08-21 ENCOUNTER — Ambulatory Visit: Admission: RE | Admit: 2019-08-21 | Payer: BC Managed Care – PPO | Source: Ambulatory Visit

## 2019-08-21 ENCOUNTER — Other Ambulatory Visit: Payer: Self-pay

## 2019-08-21 DIAGNOSIS — M799 Soft tissue disorder, unspecified: Secondary | ICD-10-CM | POA: Diagnosis not present

## 2019-08-21 DIAGNOSIS — M542 Cervicalgia: Secondary | ICD-10-CM | POA: Diagnosis not present

## 2019-08-21 DIAGNOSIS — M6281 Muscle weakness (generalized): Secondary | ICD-10-CM | POA: Diagnosis not present

## 2019-08-21 DIAGNOSIS — M7542 Impingement syndrome of left shoulder: Secondary | ICD-10-CM | POA: Diagnosis not present

## 2019-08-25 DIAGNOSIS — M6281 Muscle weakness (generalized): Secondary | ICD-10-CM | POA: Diagnosis not present

## 2019-08-25 DIAGNOSIS — M7542 Impingement syndrome of left shoulder: Secondary | ICD-10-CM | POA: Diagnosis not present

## 2019-08-25 DIAGNOSIS — M542 Cervicalgia: Secondary | ICD-10-CM | POA: Diagnosis not present

## 2019-08-25 DIAGNOSIS — M799 Soft tissue disorder, unspecified: Secondary | ICD-10-CM | POA: Diagnosis not present

## 2019-08-28 DIAGNOSIS — M542 Cervicalgia: Secondary | ICD-10-CM | POA: Diagnosis not present

## 2019-08-28 DIAGNOSIS — M7542 Impingement syndrome of left shoulder: Secondary | ICD-10-CM | POA: Diagnosis not present

## 2019-08-28 DIAGNOSIS — M799 Soft tissue disorder, unspecified: Secondary | ICD-10-CM | POA: Diagnosis not present

## 2019-08-28 DIAGNOSIS — M6281 Muscle weakness (generalized): Secondary | ICD-10-CM | POA: Diagnosis not present

## 2019-08-29 ENCOUNTER — Encounter: Payer: Self-pay | Admitting: Internal Medicine

## 2019-08-29 ENCOUNTER — Encounter: Payer: BC Managed Care – PPO | Attending: Internal Medicine | Admitting: Registered"

## 2019-08-29 ENCOUNTER — Encounter: Payer: Self-pay | Admitting: Registered"

## 2019-08-29 ENCOUNTER — Other Ambulatory Visit: Payer: Self-pay

## 2019-08-29 DIAGNOSIS — Z713 Dietary counseling and surveillance: Secondary | ICD-10-CM

## 2019-08-29 NOTE — Patient Instructions (Signed)
-   Aim to eat about every 3-5 hours.   - Aim to increase fiber intake with oatmeal, whole grains, fruit, vegetables, and nuts.   - Keep up the great work with being active throughout the week!

## 2019-08-29 NOTE — Progress Notes (Signed)
  Medical Nutrition Therapy:  Appt start time: 11:15 end time:  12:09.   Assessment:  Primary concerns today: Recent lab values reveal elevated Chol (224), Trg (185), and A1c (5.8).   Pt expectations: what to eat/not eat to help decrease cholesterol and triglycerides.   Pt states he made his triglycerdies decrease by cutting out carbohydrates. States during 2020, he was mostly following a loose keto diet. Labs show Trg was 451 (2017) and has decreased over time to 185. Chol was 194 in 2017 and remained <200 until 06/28/19 labs. Pt states he stopped eating oatmeal when he started following Keto diet and noticed his cholesterol increased. States he believes this was the problem for him.   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 States he eats out often and they (he and wife) don't have time to cook.    Preferred Learning Style:   No preference indicated   Learning Readiness:   Ready  Change in progress   MEDICATIONS: See list   DIETARY INTAKE:  Usual eating pattern includes 2 meals and 1 snacks per day.  Everyday foods include nuts, vegetables, chicken, pork.  Avoided foods include carbs.    24-hr recall:  B (9-10 AM): walnut + pecans + water/diet soda  Snk ( AM):   L (12-2 PM): Salsaritas - burrito bowl (pork, beans, sour cream, vegetables, guacamole) + chips + salsa Snk ( PM):  D (8:30 PM): [employer] - chicken + vegetables Snk ( PM):  Beverages: water, diet soda, soda  Usual physical activity: typically walks 60-75 min + circuit training 20-30 min, 3-5x/day  Estimated energy needs: 1800-2000 calories 200-225 g carbohydrates 135-150 g protein 50-56 g fat  Progress Towards Goal(s):  In progress.   Nutritional Diagnosis:  NB-1.1 Food and nutrition-related knowledge deficit As related to hypercholesterolemia.  As evidenced by verbalizes incomplete nutrition knowledge.    Intervention:  Nutrition education and counseling. Pt was educated and  counseled on high cholesterol, nutritional ways to lower cholesterol, and ways to increase fiber intake. Discussed frequency of eating and details of keto diet as it relates to cholesterol levels. Pt was in agreement with goals listed. Goals: - Aim to eat about every 3-5 hours.  - Aim to increase fiber intake with oatmeal, whole grains, fruit, vegetables, and nuts.  - Keep up the great work with being active throughout the week!  Teaching Method Utilized:  Visual Auditory Hands on  Handouts given during visit include:  Cholesterol Lowering Nutrition Therapy  Barriers to learning/adherence to lifestyle change: work-life balance  Demonstrated degree of understanding via:  Teach Back   Monitoring/Evaluation:  Dietary intake, exercise, and body weight prn.

## 2019-09-04 DIAGNOSIS — M799 Soft tissue disorder, unspecified: Secondary | ICD-10-CM | POA: Diagnosis not present

## 2019-09-04 DIAGNOSIS — M542 Cervicalgia: Secondary | ICD-10-CM | POA: Diagnosis not present

## 2019-09-04 DIAGNOSIS — M7542 Impingement syndrome of left shoulder: Secondary | ICD-10-CM | POA: Diagnosis not present

## 2019-09-04 DIAGNOSIS — M6281 Muscle weakness (generalized): Secondary | ICD-10-CM | POA: Diagnosis not present

## 2019-09-06 DIAGNOSIS — M799 Soft tissue disorder, unspecified: Secondary | ICD-10-CM | POA: Diagnosis not present

## 2019-09-06 DIAGNOSIS — M542 Cervicalgia: Secondary | ICD-10-CM | POA: Diagnosis not present

## 2019-09-06 DIAGNOSIS — M6281 Muscle weakness (generalized): Secondary | ICD-10-CM | POA: Diagnosis not present

## 2019-09-06 DIAGNOSIS — M7542 Impingement syndrome of left shoulder: Secondary | ICD-10-CM | POA: Diagnosis not present

## 2019-09-12 DIAGNOSIS — M799 Soft tissue disorder, unspecified: Secondary | ICD-10-CM | POA: Diagnosis not present

## 2019-09-12 DIAGNOSIS — M542 Cervicalgia: Secondary | ICD-10-CM | POA: Diagnosis not present

## 2019-09-12 DIAGNOSIS — M6281 Muscle weakness (generalized): Secondary | ICD-10-CM | POA: Diagnosis not present

## 2019-09-12 DIAGNOSIS — M7542 Impingement syndrome of left shoulder: Secondary | ICD-10-CM | POA: Diagnosis not present

## 2019-09-15 DIAGNOSIS — M7542 Impingement syndrome of left shoulder: Secondary | ICD-10-CM | POA: Diagnosis not present

## 2019-09-15 DIAGNOSIS — M6281 Muscle weakness (generalized): Secondary | ICD-10-CM | POA: Diagnosis not present

## 2019-09-15 DIAGNOSIS — M542 Cervicalgia: Secondary | ICD-10-CM | POA: Diagnosis not present

## 2019-09-15 DIAGNOSIS — M799 Soft tissue disorder, unspecified: Secondary | ICD-10-CM | POA: Diagnosis not present

## 2019-09-19 DIAGNOSIS — M6281 Muscle weakness (generalized): Secondary | ICD-10-CM | POA: Diagnosis not present

## 2019-09-19 DIAGNOSIS — M799 Soft tissue disorder, unspecified: Secondary | ICD-10-CM | POA: Diagnosis not present

## 2019-09-19 DIAGNOSIS — M542 Cervicalgia: Secondary | ICD-10-CM | POA: Diagnosis not present

## 2019-09-19 DIAGNOSIS — M7542 Impingement syndrome of left shoulder: Secondary | ICD-10-CM | POA: Diagnosis not present

## 2019-10-23 ENCOUNTER — Telehealth: Payer: Self-pay

## 2019-10-23 NOTE — Telephone Encounter (Signed)
Plan of care signed and faxed back to [facility] at [phone]. Form sent for scanning.  

## 2020-01-23 DIAGNOSIS — Z20822 Contact with and (suspected) exposure to covid-19: Secondary | ICD-10-CM | POA: Diagnosis not present

## 2020-02-07 DIAGNOSIS — Z20822 Contact with and (suspected) exposure to covid-19: Secondary | ICD-10-CM | POA: Diagnosis not present

## 2020-03-17 DIAGNOSIS — Z1211 Encounter for screening for malignant neoplasm of colon: Secondary | ICD-10-CM | POA: Diagnosis not present

## 2020-03-17 LAB — COLOGUARD: Cologuard: NEGATIVE

## 2020-03-19 ENCOUNTER — Other Ambulatory Visit: Payer: Self-pay

## 2020-03-20 ENCOUNTER — Other Ambulatory Visit: Payer: Self-pay

## 2020-03-20 ENCOUNTER — Other Ambulatory Visit: Payer: Self-pay | Admitting: Internal Medicine

## 2020-03-20 ENCOUNTER — Ambulatory Visit (INDEPENDENT_AMBULATORY_CARE_PROVIDER_SITE_OTHER): Payer: BC Managed Care – PPO

## 2020-03-20 ENCOUNTER — Encounter: Payer: Self-pay | Admitting: Internal Medicine

## 2020-03-20 ENCOUNTER — Ambulatory Visit (INDEPENDENT_AMBULATORY_CARE_PROVIDER_SITE_OTHER): Payer: BC Managed Care – PPO | Admitting: Internal Medicine

## 2020-03-20 VITALS — BP 128/90 | HR 89 | Temp 98.3°F | Resp 16 | Ht 70.0 in | Wt 203.2 lb

## 2020-03-20 DIAGNOSIS — E785 Hyperlipidemia, unspecified: Secondary | ICD-10-CM | POA: Diagnosis not present

## 2020-03-20 DIAGNOSIS — R002 Palpitations: Secondary | ICD-10-CM | POA: Diagnosis not present

## 2020-03-20 DIAGNOSIS — I10 Essential (primary) hypertension: Secondary | ICD-10-CM | POA: Diagnosis not present

## 2020-03-20 NOTE — Patient Instructions (Addendum)
Check the  blood pressure weekly BP GOAL is between 110/65 and  135/85. If it is consistently higher or lower, let me know   GO TO THE LAB : Get the blood work     GO TO THE FRONT DESK, PLEASE SCHEDULE YOUR APPOINTMENTS Come back for a checkup in 3 months

## 2020-03-20 NOTE — Progress Notes (Signed)
Subjective:    Patient ID: Marcus Santiago, male    DOB: 07-21-1965, 55 y.o.   MRN: [id number]  DOS:  03/20/2020 Type of visit - description: Acute 2 weeks history of palpitations. Reports a feeling of heart skipping, not going fast. Happening almost every morning for the last 2 weeks. No associated nausea, chest pain, difficulty breathing. No diaphoresis. He is not taking any new medicines or decongestants. Admits to stress, work-related, he works Technical sales engineer.  Good compliance with meds, no recent ambulatory BPs. No recent headaches.    Review of Systems See above   Past Medical History:  Diagnosis Date  . Allergy   . Chest pain    (-) stress test 2008, 2010  . Headache   . Hyperlipemia   . Hypertension     Past Surgical History:  Procedure Laterality Date  . KNEE ARTHROSCOPY     L x 2    Allergies as of 03/20/2020   No Known Allergies     Medication List       Accurate as of March 20, 2020  3:47 PM. If you have any questions, ask your nurse or doctor.        aspirin 81 MG tablet Take 81 mg by mouth daily.   azelastine 0.1 % nasal spray Commonly known as: ASTELIN Place 2 sprays into both nostrils 2 (two) times daily.   fish oil-omega-3 fatty acids 1000 MG capsule Take 2 g by mouth daily.   GLUCOSAMINE CHONDROITIN JOINT PO Take 1,200 mg by mouth daily at 6 (six) AM.   indomethacin 50 MG capsule Commonly known as: INDOCIN Take 1 capsule (50 mg total) by mouth 3 (three) times daily with meals as needed (HEADACHES).   lisinopril 20 MG tablet Commonly known as: ZESTRIL Take 1 tablet (20 mg total) by mouth daily.          Objective:   Physical Exam BP 128/90 (BP Location: Left Arm, Patient Position: Sitting, Cuff Size: Normal)   Pulse 89   Temp 98.3 F (36.8 C) (Oral)   Resp 16   Ht 5\' 10"  (1.778 m)   Wt 203 lb 4 oz (92.2 kg)   SpO2 97%   BMI 29.16 kg/m  General:   Well developed, NAD, BMI noted.  HEENT:  Normocephalic . Face  symmetric, atraumatic Lungs:  CTA B Normal respiratory effort, no intercostal retractions, no accessory muscle use. Heart: RRR,  no murmur.  Abdomen:  Not distended, soft, non-tender. No rebound or rigidity.   Skin: Not pale. Not jaundice Lower extremities: no pretibial edema bilaterally  Neurologic:  alert & oriented X3.  Speech normal, gait appropriate for age and unassisted Psych--  Cognition and judgment appear intact.  Cooperative with normal attention span and concentration.  Behavior appropriate. No anxious or depressed appearing.     Assessment       Assessment HTN Hyperlipidemia Chest pain : (-) stress test 2008, 2010 Migraines   PLAN  Palpitations: As described above, no red flag symptoms, symptoms happening in the context of anxiety. EKG today: Unable to get a better quality but seems to be on sinus rhythm, unchanged from previous. Plan: -BMP, CBC, TSH. Addendum: asked for FLP, will RTC in AM for labs  -2-day Holter  HTN: Diastolic BP slightly elevated, continue lisinopril for now, check ambulatory BPs Follow-up in 3 months     This visit occurred during the SARS-CoV-2 public health emergency.  Safety protocols were in place, including screening questions prior  to the visit, additional usage of staff PPE, and extensive cleaning of exam room while observing appropriate contact time as indicated for disinfecting solutions.

## 2020-03-21 ENCOUNTER — Other Ambulatory Visit (INDEPENDENT_AMBULATORY_CARE_PROVIDER_SITE_OTHER): Payer: BC Managed Care – PPO

## 2020-03-21 DIAGNOSIS — E785 Hyperlipidemia, unspecified: Secondary | ICD-10-CM | POA: Diagnosis not present

## 2020-03-21 DIAGNOSIS — R002 Palpitations: Secondary | ICD-10-CM

## 2020-03-21 NOTE — Assessment & Plan Note (Signed)
Palpitations: As described above, no red flag symptoms, symptoms happening in the context of anxiety. EKG today: Unable to get a better quality but seems to be on sinus rhythm, unchanged from previous. Plan: -BMP, CBC, TSH. Addendum: asked for FLP, will RTC in AM for labs  -2-day Holter  HTN: Diastolic BP slightly elevated, continue lisinopril for now, check ambulatory BPs Follow-up in 3 months

## 2020-03-22 LAB — CBC WITH DIFFERENTIAL/PLATELET
Basophils Absolute: 0 10*3/uL (ref 0.0–0.1)
Basophils Relative: 0.1 % (ref 0.0–3.0)
Eosinophils Absolute: 0 10*3/uL (ref 0.0–0.7)
Eosinophils Relative: 0.7 % (ref 0.0–5.0)
HCT: 44.9 % (ref 39.0–52.0)
Hemoglobin: 15.3 g/dL (ref 13.0–17.0)
Lymphocytes Relative: 29.1 % (ref 12.0–46.0)
Lymphs Abs: 1.5 10*3/uL (ref 0.7–4.0)
MCHC: 34.1 g/dL (ref 30.0–36.0)
MCV: 84 fl (ref 78.0–100.0)
Monocytes Absolute: 0.3 10*3/uL (ref 0.1–1.0)
Monocytes Relative: 6.5 % (ref 3.0–12.0)
Neutro Abs: 3.2 10*3/uL (ref 1.4–7.7)
Neutrophils Relative %: 63.6 % (ref 43.0–77.0)
Platelets: 266 10*3/uL (ref 150.0–400.0)
RBC: 5.35 Mil/uL (ref 4.22–5.81)
RDW: 13.6 % (ref 11.5–15.5)
WBC: 5 10*3/uL (ref 4.0–10.5)

## 2020-03-22 LAB — BASIC METABOLIC PANEL
BUN: 20 mg/dL (ref 6–23)
CO2: 28 mEq/L (ref 19–32)
Calcium: 9.4 mg/dL (ref 8.4–10.5)
Chloride: 102 mEq/L (ref 96–112)
Creatinine, Ser: 1.05 mg/dL (ref 0.40–1.50)
GFR: 81.77 mL/min (ref >60.00)
Glucose, Bld: 100 mg/dL — ABNORMAL HIGH (ref 70–99)
Potassium: 4.1 mEq/L (ref 3.5–5.1)
Sodium: 140 mEq/L (ref 135–145)

## 2020-03-22 LAB — LIPID PANEL
Cholesterol: 191 mg/dL (ref 0–200)
HDL: 39.1 mg/dL (ref >39.00)
LDL Cholesterol: 121 mg/dL — ABNORMAL HIGH (ref 0–99)
NonHDL: 152.16
Total CHOL/HDL Ratio: 5
Triglycerides: 155 mg/dL — ABNORMAL HIGH (ref 0.0–149.0)
VLDL: 31 mg/dL (ref 0.0–40.0)

## 2020-03-22 LAB — TSH: TSH: 2 u[IU]/mL (ref 0.35–4.50)

## 2020-03-27 ENCOUNTER — Encounter: Payer: Self-pay | Admitting: Internal Medicine

## 2020-03-27 DIAGNOSIS — R002 Palpitations: Secondary | ICD-10-CM

## 2020-04-01 ENCOUNTER — Encounter: Payer: Self-pay | Admitting: Internal Medicine

## 2020-04-04 DIAGNOSIS — R002 Palpitations: Secondary | ICD-10-CM | POA: Diagnosis not present

## 2020-04-17 ENCOUNTER — Encounter: Payer: Self-pay | Admitting: Internal Medicine

## 2020-04-17 MED ORDER — LISINOPRIL 20 MG PO TABS: 20.0000 mg | ORAL_TABLET | Freq: Every day | ORAL | 1 refills | Status: DC

## 2020-05-13 DIAGNOSIS — J309 Allergic rhinitis, unspecified: Secondary | ICD-10-CM | POA: Diagnosis not present

## 2020-05-13 DIAGNOSIS — J00 Acute nasopharyngitis [common cold]: Secondary | ICD-10-CM | POA: Diagnosis not present

## 2020-05-26 DIAGNOSIS — K529 Noninfective gastroenteritis and colitis, unspecified: Secondary | ICD-10-CM | POA: Diagnosis not present

## 2020-07-16 ENCOUNTER — Encounter: Payer: Self-pay | Admitting: Internal Medicine

## 2020-07-17 ENCOUNTER — Telehealth (INDEPENDENT_AMBULATORY_CARE_PROVIDER_SITE_OTHER): Payer: BC Managed Care – PPO | Admitting: Family

## 2020-07-17 ENCOUNTER — Other Ambulatory Visit: Payer: Self-pay

## 2020-07-17 VITALS — Temp 98.3°F

## 2020-07-17 DIAGNOSIS — U071 COVID-19: Secondary | ICD-10-CM

## 2020-07-17 MED ORDER — MOLNUPIRAVIR 200 MG PO CAPS: 800.0000 mg | ORAL_CAPSULE | Freq: Two times a day (BID) | ORAL | 0 refills | Status: AC

## 2020-07-17 MED ORDER — CHERATUSSIN AC 100-10 MG/5ML PO SOLN: 5.0000 mL | Freq: Three times a day (TID) | ORAL | 0 refills | Status: DC | PRN

## 2020-07-17 NOTE — Progress Notes (Signed)
MyChart Video Visit    Virtual Visit via Video Note   This visit type was conducted due to national recommendations for restrictions regarding the COVID-19 Pandemic (e.g. social distancing) in an effort to limit this patient's exposure and mitigate transmission in our community. This patient is at least at moderate risk for complications without adequate follow up. This format is felt to be most appropriate for this patient at this time. Physical exam was limited by quality of the video and audio technology used for the visit. CMA was able to get the patient set up on a video visit.  Patient location: Home. Patient and provider in visit Provider location: Office  I discussed the limitations of evaluation and management by telemedicine and the availability of in person appointments. The patient expressed understanding and agreed to proceed.  Visit Date: 07/17/2020  Today's healthcare provider: Lemont Fillers, NP     Subjective:    Patient ID: Marcus Santiago, male    DOB: April 30, 1965, 55 y.o.   MRN: [id number]  Chief Complaint  Patient presents with   Covid Positive    Tested positive 07-16-20   Covid symptoms    Complains of sore throat, chills, cough, nasal congestion and body aches    HPI  Patient is a 55 yr old male who presents today to discuss covid-19 infection. He reports that symptoms began Sunday night 07/14/20.  He reports initial symptoms included cough and runny nose.  Took home test yesterday and was positive for covid x 2.  Family members are well currently.  His current symptoms include sore throat, body aches, chills, sneezing, runny nose. No fever. He has had 3 moderna shots. He has not had a second booster. He works in an office setting.    Past Medical History:  Diagnosis Date   Allergy    Chest pain    (-) stress test 2008, 2010   Headache    Hyperlipemia    Hypertension     Past Surgical History:  Procedure Laterality Date   KNEE  ARTHROSCOPY     L x 2    Family History  Problem Relation Age of Onset   Prostate cancer Other        uncle   CAD Father        F first MI ~ 97, GF, uncle    Heart disease Father    Hyperlipidemia Father    Hypertension Father    Stroke Other        GF   Lupus Mother    Diabetes Paternal Grandmother    Heart disease Paternal Grandfather    Stroke Paternal Grandfather    Colon cancer Neg Hx     Social History   Socioeconomic History   Marital status: Married    Spouse name: Not on file   Number of children: 0   Years of education: Not on file   Highest education level: Not on file  Occupational History   Occupation: tax preparation   Tobacco Use   Smoking status: Never   Smokeless tobacco: Never  Vaping Use   Vaping Use: Never used  Substance and Sexual Activity   Alcohol use: Yes    Comment: rare   Drug use: No   Sexual activity: Yes    Partners: Female    Birth control/protection: Surgical  Other Topics Concern   Not on file  Social History Narrative   Not on file   Social Determinants of Health  Financial Resource Strain: Not on file  Food Insecurity: Not on file  Transportation Needs: Not on file  Physical Activity: Not on file  Stress: Not on file  Social Connections: Not on file  Intimate Partner Violence: Not on file    Outpatient Medications Prior to Visit  Medication Sig Dispense Refill   aspirin 81 MG tablet Take 81 mg by mouth daily.     azelastine (ASTELIN) 0.1 % nasal spray Place 2 sprays into both nostrils 2 (two) times daily. 30 mL 6   fish oil-omega-3 fatty acids 1000 MG capsule Take 2 g by mouth daily.     Glucos-Chondroit-Hyaluron-MSM (GLUCOSAMINE CHONDROITIN JOINT PO) Take 1,200 mg by mouth daily at 6 (six) AM.     indomethacin (INDOCIN) 50 MG capsule Take 1 capsule (50 mg total) by mouth 3 (three) times daily with meals as needed (HEADACHES). (Patient not taking: No sig reported) 30 capsule 0   lisinopril (ZESTRIL) 20 MG tablet  Take 1 tablet (20 mg total) by mouth daily. 90 tablet 1   No facility-administered medications prior to visit.    No Known Allergies  ROS See HPI    Objective:    Physical Exam  Temp 98.3 F (36.8 C)  Wt Readings from Last 3 Encounters:  03/20/20 203 lb 4 oz (92.2 kg)  07/11/19 206 lb 2 oz (93.5 kg)  11/07/18 216 lb (98 kg)    Gen: Awake, alert, no acute distress Resp: Breathing is even and non-labored Psych: calm/pleasant demeanor Neuro: Alert and Oriented x 3, + facial symmetry, speech is clear.     Assessment & Plan:   Problem List Items Addressed This Visit   None   I am having Marcus Santiago maintain his fish oil-omega-3 fatty acids, aspirin, Glucos-Chondroit-Hyaluron-MSM (GLUCOSAMINE CHONDROITIN JOINT PO), azelastine, indomethacin, and lisinopril.  No orders of the defined types were placed in this encounter.   I discussed the assessment and treatment plan with the patient. The patient was provided an opportunity to ask questions and all were answered. The patient agreed with the plan and demonstrated an understanding of the instructions.   The patient was advised to call back or seek an in-person evaluation if the symptoms worsen or if the condition fails to improve as anticipated.  Lemont Fillers, NP [employer] [facility] [employer] [phone] (phone) [phone] (fax)  [facility]

## 2020-07-17 NOTE — Assessment & Plan Note (Addendum)
New. Will rx with Molnupiravir 800mg  bid x 5 days rather than Paxlovid as we do not have a serum Cr in the last 90 days. He is requesting cheratussin cough syrup (reviewed [place] Controlled substance registry). Rx sent for 49ml TID PRN.  Advised of CDC guidelines for self isolation/ ending isolation.  Advised of safe practice guidelines. Symptom Tier reviewed.  Encouraged to monitor for any worsening symptoms; watch for increased shortness of breath, weakness, and signs of dehydration. Advised when to seek emergency care.  Instructed to rest and hydrate well.  Advised to leave the house during recommended isolation period, only if it is necessary to seek medical care. Pt verbalizes understanding.

## 2020-10-24 ENCOUNTER — Other Ambulatory Visit: Payer: Self-pay | Admitting: Internal Medicine

## 2020-11-02 DIAGNOSIS — M79644 Pain in right finger(s): Secondary | ICD-10-CM | POA: Diagnosis not present

## 2021-03-27 ENCOUNTER — Encounter: Payer: Self-pay | Admitting: Internal Medicine

## 2021-04-21 ENCOUNTER — Encounter: Payer: Self-pay | Admitting: Internal Medicine

## 2021-04-21 ENCOUNTER — Ambulatory Visit (INDEPENDENT_AMBULATORY_CARE_PROVIDER_SITE_OTHER): Payer: BC Managed Care – PPO | Admitting: Internal Medicine

## 2021-04-21 VITALS — BP 126/80 | HR 87 | Temp 98.7°F | Resp 16 | Ht 70.0 in | Wt 201.1 lb

## 2021-04-21 DIAGNOSIS — Z1159 Encounter for screening for other viral diseases: Secondary | ICD-10-CM

## 2021-04-21 DIAGNOSIS — E785 Hyperlipidemia, unspecified: Secondary | ICD-10-CM | POA: Diagnosis not present

## 2021-04-21 DIAGNOSIS — R739 Hyperglycemia, unspecified: Secondary | ICD-10-CM

## 2021-04-21 DIAGNOSIS — I10 Essential (primary) hypertension: Secondary | ICD-10-CM

## 2021-04-21 DIAGNOSIS — Z0000 Encounter for general adult medical examination without abnormal findings: Secondary | ICD-10-CM | POA: Diagnosis not present

## 2021-04-21 NOTE — Patient Instructions (Signed)
Check the  blood pressure regularly, 2-3 times a month? ?BP GOAL is between 110/65 and  135/85. ?If it is consistently higher or lower, let me know ? ?Watch her salt intake and exercise regularly ? ?GO TO THE LAB : Get the blood work   ? ? ?GO TO THE FRONT DESK, PLEASE SCHEDULE YOUR APPOINTMENTS ?Come back for checkup in 6 months ?

## 2021-04-21 NOTE — Assessment & Plan Note (Signed)
--  Td 2018 ?- shingrix d/w pt  ?-  covid vaccines utd  ?-- + FH prostate ca (uncle); no symptoms, DRE normal, check PSA. ?--CCS: Never had a colonoscopy, no FH, (-) IFOB 12/2017.  (-) Cologuard 02/2020  ?--Diet and exercise: Not doing well lately, patient is advised ?--Labs: Will come back fasting for CMP, FLP, CBC, A1c, PSA ?

## 2021-04-21 NOTE — Assessment & Plan Note (Signed)
Here for CPX ?HTN: Self stopped lisinopril about 3 months ago, BP today is very good.  That is okay as long as he keep an eye on his blood pressure.  Goals provided. ?Hyperlipidemia, last LDL 121, CV risk factor 10.2%, + FH, we will recheck a lipid panel, benefits of calcium coronary score discussed.  We will proceed. ?Palpitations, see last visit, Holter monitor occasional symptomatic PVC, benign issue.  No further symptoms ?RTC 6 months ?

## 2021-04-21 NOTE — Progress Notes (Signed)
? ?Subjective:  ? ? Patient ID: Marcus Santiago, male    DOB: 07/17/1965, 56 y.o.   MRN: [id number] ? ?DOS:  04/21/2021 ?Type of visit - description: cpx ? ?Here for CPX ?In general feeling well. ?Had some back pain few months ago that is largely resolved. ?Had palpitations, that is resolved. ? ?Review of Systems ? ?Other than above, a 14 point review of systems is negative  ? ?  ? ?Past Medical History:  ?Diagnosis Date  ? Allergy   ? Chest pain   ? (-) stress test 2008, 2010  ? Headache   ? Hyperlipemia   ? Hypertension   ? ? ?Past Surgical History:  ?Procedure Laterality Date  ? KNEE ARTHROSCOPY    ? L x 2  ? ?Social History  ? ?Socioeconomic History  ? Marital status: Married  ?  Spouse name: Not on file  ? Number of children: 0  ? Years of education: Not on file  ? Highest education level: Not on file  ?Occupational History  ? Occupation: tax preparation   ?Tobacco Use  ? Smoking status: Never  ? Smokeless tobacco: Never  ?Vaping Use  ? Vaping Use: Never used  ?Substance and Sexual Activity  ? Alcohol use: Yes  ?  Comment: rare  ? Drug use: No  ? Sexual activity: Yes  ?  Partners: Female  ?  Birth control/protection: Surgical  ?Other Topics Concern  ? Not on file  ?Social History Narrative  ? Household- pt and wife  ? ?Social Determinants of Health  ? ?Financial Resource Strain: Not on file  ?Food Insecurity: Not on file  ?Transportation Needs: Not on file  ?Physical Activity: Not on file  ?Stress: Not on file  ?Social Connections: Not on file  ?Intimate Partner Violence: Not on file  ? ? ? ?Current Outpatient Medications  ?Medication Instructions  ? aspirin 81 mg, Daily  ? fish oil-omega-3 fatty acids 2 g, Daily  ? Glucos-Chondroit-Hyaluron-MSM (GLUCOSAMINE CHONDROITIN JOINT PO) 1,200 mg, Daily  ? ? ?   ?Objective:  ? Physical Exam ?BP 126/80 (BP Location: Left Arm, Patient Position: Sitting, Cuff Size: Normal)   Pulse 87   Temp 98.7 ?F (37.1 ?C) (Oral)   Resp 16   Ht 5\' 10"  (1.778 m)   Wt 201 lb 2 oz (91.2  kg)   SpO2 96%   BMI 28.86 kg/m?  ?General: ?Well developed, NAD, BMI noted ?Neck: No  thyromegaly  ?HEENT:  ?Normocephalic . Face symmetric, atraumatic ?Lungs:  ?CTA B ?Normal respiratory effort, no intercostal retractions, no accessory muscle use. ?Heart: RRR,  no murmur.  ?Abdomen:  ?Not distended, soft, non-tender. No rebound or rigidity.   ?Lower extremities: no pretibial edema bilaterally ?DRE: Normal sphincter tone, no stools.  Prostate normal ?Skin: Exposed areas without rash. Not pale. Not jaundice ?Neurologic:  ?alert & oriented X3.  ?Speech normal, gait appropriate for age and unassisted ?Strength symmetric and appropriate for age.  ?Psych: ?Cognition and judgment appear intact.  ?Cooperative with normal attention span and concentration.  ?Behavior appropriate. ?No anxious or depressed appearing. ? ?   ?Assessment   ? ?Assessment ?HTN ?Hyperlipidemia ?Chest pain : (-) stress test 2008, 2010 ?Migraines ?FH CAD: F, GF ? ?PLAN  ?Here for CPX ?HTN: Self stopped lisinopril about 3 months ago, BP today is very good.  That is okay as long as he keep an eye on his blood pressure.  Goals provided. ?Hyperlipidemia, last LDL 121, CV risk factor 10.2%, +  FH, we will recheck a lipid panel, benefits of calcium coronary score discussed.  We will proceed. ?Palpitations, see last visit, Holter monitor occasional symptomatic PVC, benign issue.  No further symptoms ?RTC 6 months ? ? ? ?  ?This visit occurred during the SARS-CoV-2 public health emergency.  Safety protocols were in place, including screening questions prior to the visit, additional usage of staff PPE, and extensive cleaning of exam room while observing appropriate contact time as indicated for disinfecting solutions.  ? ?

## 2021-04-23 ENCOUNTER — Other Ambulatory Visit (INDEPENDENT_AMBULATORY_CARE_PROVIDER_SITE_OTHER): Payer: BC Managed Care – PPO

## 2021-04-23 DIAGNOSIS — E785 Hyperlipidemia, unspecified: Secondary | ICD-10-CM

## 2021-04-23 DIAGNOSIS — R739 Hyperglycemia, unspecified: Secondary | ICD-10-CM | POA: Diagnosis not present

## 2021-04-23 DIAGNOSIS — Z1159 Encounter for screening for other viral diseases: Secondary | ICD-10-CM

## 2021-04-23 DIAGNOSIS — Z0000 Encounter for general adult medical examination without abnormal findings: Secondary | ICD-10-CM

## 2021-04-23 DIAGNOSIS — I10 Essential (primary) hypertension: Secondary | ICD-10-CM

## 2021-04-23 LAB — COMPREHENSIVE METABOLIC PANEL
ALT: 11 U/L (ref 0–53)
AST: 18 U/L (ref 0–37)
Albumin: 4.6 g/dL (ref 3.5–5.2)
Alkaline Phosphatase: 52 U/L (ref 39–117)
BUN: 19 mg/dL (ref 6–23)
CO2: 29 mEq/L (ref 19–32)
Calcium: 9.3 mg/dL (ref 8.4–10.5)
Chloride: 104 mEq/L (ref 96–112)
Creatinine, Ser: 0.99 mg/dL (ref 0.40–1.50)
GFR: 87.09 mL/min (ref >60.00)
Glucose, Bld: 91 mg/dL (ref 70–99)
Potassium: 4.1 mEq/L (ref 3.5–5.1)
Sodium: 139 mEq/L (ref 135–145)
Total Bilirubin: 0.9 mg/dL (ref 0.2–1.2)
Total Protein: 7.1 g/dL (ref 6.0–8.3)

## 2021-04-23 LAB — CBC WITH DIFFERENTIAL/PLATELET
Basophils Absolute: 0 10*3/uL (ref 0.0–0.1)
Basophils Relative: 0.4 % (ref 0.0–3.0)
Eosinophils Absolute: 0 10*3/uL (ref 0.0–0.7)
Eosinophils Relative: 0.6 % (ref 0.0–5.0)
HCT: 44 % (ref 39.0–52.0)
Hemoglobin: 14.7 g/dL (ref 13.0–17.0)
Lymphocytes Relative: 28.6 % (ref 12.0–46.0)
Lymphs Abs: 1.5 10*3/uL (ref 0.7–4.0)
MCHC: 33.5 g/dL (ref 30.0–36.0)
MCV: 86.3 fl (ref 78.0–100.0)
Monocytes Absolute: 0.4 10*3/uL (ref 0.1–1.0)
Monocytes Relative: 6.9 % (ref 3.0–12.0)
Neutro Abs: 3.3 10*3/uL (ref 1.4–7.7)
Neutrophils Relative %: 63.5 % (ref 43.0–77.0)
Platelets: 282 10*3/uL (ref 150.0–400.0)
RBC: 5.09 Mil/uL (ref 4.22–5.81)
RDW: 13.6 % (ref 11.5–15.5)
WBC: 5.2 10*3/uL (ref 4.0–10.5)

## 2021-04-23 LAB — LIPID PANEL
Cholesterol: 208 mg/dL — ABNORMAL HIGH (ref 0–200)
HDL: 43.8 mg/dL (ref >39.00)
LDL Cholesterol: 141 mg/dL — ABNORMAL HIGH (ref 0–99)
NonHDL: 164.59
Total CHOL/HDL Ratio: 5
Triglycerides: 120 mg/dL (ref 0.0–149.0)
VLDL: 24 mg/dL (ref 0.0–40.0)

## 2021-04-23 LAB — PSA: PSA: 0.38 ng/mL (ref 0.10–4.00)

## 2021-04-23 LAB — HEMOGLOBIN A1C: Hgb A1c MFr Bld: 5.7 % (ref 4.6–6.5)

## 2021-04-24 LAB — HEPATITIS C ANTIBODY
Hepatitis C Ab: NONREACTIVE
SIGNAL TO CUT-OFF: 0.11 (ref <1.00)

## 2021-05-08 ENCOUNTER — Encounter: Payer: Self-pay | Admitting: Internal Medicine

## 2021-05-08 ENCOUNTER — Other Ambulatory Visit: Payer: Self-pay | Admitting: Internal Medicine

## 2021-05-08 MED ORDER — CYCLOBENZAPRINE HCL 10 MG PO TABS: 10.0000 mg | ORAL_TABLET | Freq: Two times a day (BID) | ORAL | 0 refills | Status: DC | PRN

## 2021-05-09 ENCOUNTER — Ambulatory Visit (HOSPITAL_BASED_OUTPATIENT_CLINIC_OR_DEPARTMENT_OTHER)
Admission: RE | Admit: 2021-05-09 | Discharge: 2021-05-09 | Disposition: A | Discharge status: Home / Self Care | Payer: BC Managed Care – PPO | Source: Ambulatory Visit | Attending: Internal Medicine | Admitting: Internal Medicine

## 2021-05-09 DIAGNOSIS — E785 Hyperlipidemia, unspecified: Secondary | ICD-10-CM | POA: Insufficient documentation

## 2021-06-18 ENCOUNTER — Encounter: Payer: Self-pay | Admitting: Internal Medicine

## 2021-06-23 ENCOUNTER — Encounter: Payer: Self-pay | Admitting: Internal Medicine

## 2021-06-23 ENCOUNTER — Ambulatory Visit (INDEPENDENT_AMBULATORY_CARE_PROVIDER_SITE_OTHER): Payer: BC Managed Care – PPO | Admitting: Internal Medicine

## 2021-06-23 VITALS — BP 142/92 | HR 79 | Temp 98.2°F | Resp 18 | Ht 70.0 in | Wt 203.5 lb

## 2021-06-23 DIAGNOSIS — J302 Other seasonal allergic rhinitis: Secondary | ICD-10-CM | POA: Diagnosis not present

## 2021-06-23 NOTE — Patient Instructions (Signed)
-  Use over-the-counter Flonase: 2 nasal sprays on each side of the nose in the morning    -Use OTC Astepro 2 nasal sprays on each side of the nose twice daily    -Over-the-counter Zyrtec 10 mg: 1 tablet daily for allergies   -Robitussin or Mucinex as needed if cough  - Call if not gradually better over the next  10 days

## 2021-06-23 NOTE — Progress Notes (Unsigned)
   Subjective:    Patient ID: Marcus Santiago, male    DOB: Feb 26, 1965, 56 y.o.   MRN: [id number]  DOS:  06/23/2021 Type of visit - description: Acute  Having allergies for several weeks. He went to [place] 06/10/2021, tested negative for COVID before leaving the [place].   While there symptoms increased: Worse runny nose, sneezing, cough, headache. He felt slightly short of breath, he feels it was d/t  altitude. Symptoms peaked on 06/15/2021 and since then they are improving.  He returned to [facility] 06/19/2021, current symptoms are sneezing, mild runny nose and feeling fatigued. No cough, no sore throat, no myalgias He did not check his temperature  throughout last few weeks but from time to time he had subjective fever.  Review of Systems See above   Past Medical History:  Diagnosis Date   Allergy    Chest pain    (-) stress test 2008, 2010   Headache    Hyperlipemia    Hypertension     Past Surgical History:  Procedure Laterality Date   KNEE ARTHROSCOPY     L x 2    Current Outpatient Medications  Medication Instructions   aspirin 81 mg, Daily   cyclobenzaprine (FLEXERIL) 10 mg, Oral, 2 times daily PRN   fish oil-omega-3 fatty acids 2 g, Daily   Glucos-Chondroit-Hyaluron-MSM (GLUCOSAMINE CHONDROITIN JOINT PO) 1,200 mg, Daily       Objective:   Physical Exam BP (!) 142/92   Pulse 79   Temp 98.2 F (36.8 C) (Oral)   Resp 18   Ht 5\' 10"  (1.778 m)   Wt 203 lb 8 oz (92.3 kg)   SpO2 97%   BMI 29.20 kg/m  General:   Well developed, NAD, BMI noted. HEENT:  Normocephalic . Face symmetric, atraumatic. L TM slightly bulged but not red. R and TM normal. Nose: Slightly congested Throat: Symmetric, no white patches. Lungs:  CTA B Normal respiratory effort, no intercostal retractions, no accessory muscle use. Heart: RRR,  no murmur.  Lower extremities: no pretibial edema bilaterally  Skin: Not pale. Not jaundice Neurologic:  alert & oriented X3.  Speech  normal, gait appropriate for age and unassisted Psych--  Cognition and judgment appear intact.  Cooperative with normal attention span and concentration.  Behavior appropriate. No anxious or depressed appearing.      Assessment    Assessment HTN Hyperlipidemia Chest pain : (-) stress test 2008, 2010 Migraines FH CAD: F, GF  PLAN  Allergies: Having upper respiratory symptoms for weeks, probably allergies.  However symptoms increased and they peaked 06/15/2021,   intercurrent viral illness or even COVID ?   At this point he does not look toxic, I do not think is necessary to test him for viral infections. Recommend conservative treatment with Flonase, Astepro, Zyrtec. Call if not gradually better.  Antibiotics?06/17/2021

## 2021-06-24 NOTE — Assessment & Plan Note (Signed)
Allergies: Having upper respiratory symptoms for weeks, probably allergies.  However symptoms increased and they peaked 06/15/2021,   intercurrent viral illness or even COVID ?   At this point he does not look toxic, I do not think is necessary to test him for viral infections. Recommend conservative treatment with Flonase, Astepro, Zyrtec. Call if not gradually better.  Antibiotics?Marland Kitchen

## 2021-10-02 DIAGNOSIS — F4320 Adjustment disorder, unspecified: Secondary | ICD-10-CM | POA: Diagnosis not present

## 2021-10-09 DIAGNOSIS — F4320 Adjustment disorder, unspecified: Secondary | ICD-10-CM | POA: Diagnosis not present

## 2021-10-21 ENCOUNTER — Encounter: Payer: Self-pay | Admitting: Internal Medicine

## 2021-10-21 ENCOUNTER — Ambulatory Visit: Payer: BC Managed Care – PPO | Admitting: Internal Medicine

## 2021-10-23 DIAGNOSIS — F4320 Adjustment disorder, unspecified: Secondary | ICD-10-CM | POA: Diagnosis not present

## 2021-10-30 DIAGNOSIS — F4320 Adjustment disorder, unspecified: Secondary | ICD-10-CM | POA: Diagnosis not present

## 2021-11-20 DIAGNOSIS — F4320 Adjustment disorder, unspecified: Secondary | ICD-10-CM | POA: Diagnosis not present

## 2021-12-04 DIAGNOSIS — F4320 Adjustment disorder, unspecified: Secondary | ICD-10-CM | POA: Diagnosis not present

## 2021-12-27 ENCOUNTER — Telehealth: Payer: BC Managed Care – PPO | Admitting: Nurse Practitioner

## 2021-12-27 DIAGNOSIS — R6889 Other general symptoms and signs: Secondary | ICD-10-CM

## 2021-12-27 MED ORDER — OSELTAMIVIR PHOSPHATE 75 MG PO CAPS: 75.0000 mg | ORAL_CAPSULE | Freq: Two times a day (BID) | ORAL | 0 refills | Status: AC

## 2021-12-27 MED ORDER — PROMETHAZINE-DM 6.25-15 MG/5ML PO SYRP: 5.0000 mL | ORAL_SOLUTION | Freq: Four times a day (QID) | ORAL | 0 refills | Status: DC | PRN

## 2021-12-27 NOTE — Progress Notes (Signed)
Virtual Visit Consent   Marcus Santiago, you are scheduled for a virtual visit with a [facility] provider today. Just as with appointments in the office, your consent must be obtained to participate. Your consent will be active for this visit and any virtual visit you may have with one of our providers in the next 365 days. If you have a MyChart account, a copy of this consent can be sent to you electronically.  As this is a virtual visit, video technology does not allow for your provider to perform a traditional examination. This may limit your provider's ability to fully assess your condition. If your provider identifies any concerns that need to be evaluated in person or the need to arrange testing (such as labs, EKG, etc.), we will make arrangements to do so. Although advances in technology are sophisticated, we cannot ensure that it will always work on either your end or our end. If the connection with a video visit is poor, the visit may have to be switched to a telephone visit. With either a video or telephone visit, we are not always able to ensure that we have a secure connection.  By engaging in this virtual visit, you consent to the provision of healthcare and authorize for your insurance to be billed (if applicable) for the services provided during this visit. Depending on your insurance coverage, you may receive a charge related to this service.  I need to obtain your verbal consent now. Are you willing to proceed with your visit today? Marcus Santiago has provided verbal consent on 12/27/2021 for a virtual visit (video or telephone). Claiborne Rigg, NP  Date: 12/27/2021 9:18 AM  Virtual Visit via Video Note   I, Claiborne Rigg, connected with  Marcus Santiago  ([id number], 05-Nov-1965) on 12/27/21 at  9:15 AM EST by a video-enabled telemedicine application and verified that I am speaking with the correct person using two identifiers.  Location: Patient: Virtual Visit Location  Patient: Home Provider: Virtual Visit Location Provider: Home Office   I discussed the limitations of evaluation and management by telemedicine and the availability of in person appointments. The patient expressed understanding and agreed to proceed.    History of Present Illness: Marcus Santiago is a 56 y.o. who identifies as a male who was assigned male at birth, and is being seen today for flu like symptoms.  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  Marcus Santiago himself has been experiencing flulike symptoms with onset 2 days ago.  He currently endorses symptoms of sore throat, fever with Tmax 100.7, diarrhea, chills, cough and bodyaches.  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   PCP NOTES >>> 11/07/2014   Annual physical exam 07/04/2012   HYPERLIPIDEMIA 10/25/2007   Migraine without aura 10/15/2006   Essential hypertension 08/30/2006    Allergies: No Known Allergies Medications:  Current Outpatient Medications:    oseltamivir (TAMIFLU) 75 MG capsule, Take 1 capsule (75 mg total) by mouth 2 (two) times daily for 5 days., Disp: 10 capsule, Rfl: 0   promethazine-dextromethorphan (PROMETHAZINE-DM) 6.25-15 MG/5ML syrup, Take 5 mLs by mouth 4 (four) times daily as needed for cough., Disp: 240 mL, Rfl: 0   aspirin 81 MG tablet,  Take 81 mg by mouth daily., Disp: , Rfl:    cyclobenzaprine (FLEXERIL) 10 MG tablet, Take 1 tablet (10 mg total) by mouth 2 (two) times daily as needed for muscle spasms., Disp: 20 tablet, Rfl: 0   fish oil-omega-3 fatty acids 1000 MG capsule, Take 2 g by mouth daily., Disp: , Rfl:    Glucos-Chondroit-Hyaluron-MSM (GLUCOSAMINE CHONDROITIN JOINT PO), Take 1,200 mg by mouth daily at 6 (six) AM., Disp: ,  Rfl:   Observations/Objective: Patient is well-developed, well-nourished in no acute distress.  Resting comfortably at home.  Head is normocephalic, atraumatic.  No labored breathing.  Speech is clear and coherent with logical content.  Patient is alert and oriented at baseline.    Assessment and Plan: 1. Flu-like symptoms - oseltamivir (TAMIFLU) 75 MG capsule; Take 1 capsule (75 mg total) by mouth 2 (two) times daily for 5 days.  Dispense: 10 capsule; Refill: 0 - promethazine-dextromethorphan (PROMETHAZINE-DM) 6.25-15 MG/5ML syrup; Take 5 mLs by mouth 4 (four) times daily as needed for cough.  Dispense: 240 mL; Refill: 0  INSTRUCTIONS: use a humidifier for nasal congestion Drink plenty of fluids, rest and wash hands frequently to avoid the spread of infection Alternate tylenol and Motrin for relief of fever   Follow Up Instructions: I discussed the assessment and treatment plan with the patient. The patient was provided an opportunity to ask questions and all were answered. The patient agreed with the plan and demonstrated an understanding of the instructions.  A copy of instructions were sent to the patient via MyChart unless otherwise noted below.    The patient was advised to call back or seek an in-person evaluation if the symptoms worsen or if the condition fails to improve as anticipated.  Time:  I spent 11 minutes with the patient via telehealth technology discussing the above problems/concerns.    Claiborne Rigg, NP

## 2021-12-27 NOTE — Patient Instructions (Signed)
  Marcus Santiago, thank you for joining Claiborne Rigg, NP for today's virtual visit.  While this provider is not your primary care provider (PCP), if your PCP is located in our provider database this encounter information will be shared with them immediately following your visit.   A Marcus Santiago MyChart account gives you access to today's visit and all your visits, tests, and labs performed at [facility] " click here if you don't have a [place] MyChart account or go to [email or web address]  Consent: (Patient) Marcus Santiago provided verbal consent for this virtual visit at the beginning of the encounter.  Current Medications:  Current Outpatient Medications:    oseltamivir (TAMIFLU) 75 MG capsule, Take 1 capsule (75 mg total) by mouth 2 (two) times daily for 5 days., Disp: 10 capsule, Rfl: 0   promethazine-dextromethorphan (PROMETHAZINE-DM) 6.25-15 MG/5ML syrup, Take 5 mLs by mouth 4 (four) times daily as needed for cough., Disp: 240 mL, Rfl: 0   aspirin 81 MG tablet, Take 81 mg by mouth daily., Disp: , Rfl:    cyclobenzaprine (FLEXERIL) 10 MG tablet, Take 1 tablet (10 mg total) by mouth 2 (two) times daily as needed for muscle spasms., Disp: 20 tablet, Rfl: 0   fish oil-omega-3 fatty acids 1000 MG capsule, Take 2 g by mouth daily., Disp: , Rfl:    Glucos-Chondroit-Hyaluron-MSM (GLUCOSAMINE CHONDROITIN JOINT PO), Take 1,200 mg by mouth daily at 6 (six) AM., Disp: , Rfl:    Medications ordered in this encounter:  Meds ordered this encounter  Medications   oseltamivir (TAMIFLU) 75 MG capsule    Sig: Take 1 capsule (75 mg total) by mouth 2 (two) times daily for 5 days.    Dispense:  10 capsule    Refill:  0    Order Specific Question:   Supervising Provider    Answer:   Merrilee Jansky [id number]   promethazine-dextromethorphan (PROMETHAZINE-DM) 6.25-15 MG/5ML syrup    Sig: Take 5 mLs by mouth 4 (four) times daily as needed for cough.    Dispense:  240 mL     Refill:  0    Order Specific Question:   Supervising Provider    Answer:   Merrilee Jansky [[id number]]     *If you need refills on other medications prior to your next appointment, please contact your pharmacy*  Follow-Up: Call back or seek an in-person evaluation if the symptoms worsen or if the condition fails to improve as anticipated.  [facility] [phone]  Other Instructions INSTRUCTIONS: use a humidifier for nasal congestion Drink plenty of fluids, rest and wash hands frequently to avoid the spread of infection Alternate tylenol and Motrin for relief of fever    If you have been instructed to have an in-person evaluation today at a local Urgent Care facility, please use the link below. It will take you to a list of all of our available [place] [facility], including address, phone number and hours of operation. Please do not delay care.  [place] [facility] [facility]  If you or a family member do not have a primary care provider, use the link below to schedule a visit and establish care. When you choose a [place] primary care physician or advanced practice provider, you gain a long-term partner in health. Find a Primary Care Provider  Learn more about [place]'s in-office and virtual care options: [facility] [facility] Now

## 2021-12-30 ENCOUNTER — Telehealth: Payer: BC Managed Care – PPO | Admitting: Family

## 2022-03-18 ENCOUNTER — Ambulatory Visit (INDEPENDENT_AMBULATORY_CARE_PROVIDER_SITE_OTHER): Payer: BC Managed Care – PPO | Admitting: Internal Medicine

## 2022-03-18 ENCOUNTER — Encounter: Payer: Self-pay | Admitting: Internal Medicine

## 2022-03-18 VITALS — BP 126/62 | HR 62 | Temp 98.2°F | Resp 16 | Ht 70.0 in | Wt 201.4 lb

## 2022-03-18 DIAGNOSIS — R1030 Lower abdominal pain, unspecified: Secondary | ICD-10-CM | POA: Diagnosis not present

## 2022-03-18 LAB — URINALYSIS, ROUTINE W REFLEX MICROSCOPIC
Bilirubin Urine: NEGATIVE
Hgb urine dipstick: NEGATIVE
Ketones, ur: NEGATIVE
Leukocytes,Ua: NEGATIVE
Nitrite: NEGATIVE
RBC / HPF: NONE SEEN (ref 0–?)
Specific Gravity, Urine: 1.02 (ref 1.000–1.030)
Total Protein, Urine: NEGATIVE
Urine Glucose: NEGATIVE
Urobilinogen, UA: 0.2 (ref 0.0–1.0)
pH: 7 (ref 5.0–8.0)

## 2022-03-18 LAB — POC URINALSYSI DIPSTICK (AUTOMATED)
Bilirubin, UA: NEGATIVE
Glucose, UA: NEGATIVE
Ketones, UA: NEGATIVE
Leukocytes, UA: NEGATIVE
Nitrite, UA: NEGATIVE
Protein, UA: NEGATIVE
Spec Grav, UA: 1.01 (ref 1.010–1.025)
Urobilinogen, UA: 0.2 E.U./dL
pH, UA: 7.5 (ref 5.0–8.0)

## 2022-03-18 NOTE — Progress Notes (Signed)
   Subjective:    Patient ID: Marcus Santiago, male    DOB: [birth year], 57 y.o.   MRN: [id number]  DOS:  03/18/2022 Type of visit - description: acute   2 weeks ago, had  a couple of  episodes of sharp anal pain.  It self resolved.  Has not returned. Denies any blood in the stools.  No nausea vomiting.  No diarrhea  For the last 3 days he also experienced genital pain: Sharp, last few seconds, at the left side of the scrotum, also within the left scrotum. He has not self examine the area.  Unknown if there was any swelling or lump.  He denies dysuria, gross hematuria.  No difficulty urinating.  No rash  Review of Systems See above   Past Medical History:  Diagnosis Date   Allergy    Chest pain    (-) stress test 2008, 2010   Headache    Hyperlipemia    Hypertension     Past Surgical History:  Procedure Laterality Date   KNEE ARTHROSCOPY     L x 2    Current Outpatient Medications  Medication Instructions   aspirin 81 mg, Daily   cyclobenzaprine (FLEXERIL) 10 mg, Oral, 2 times daily PRN   fish oil-omega-3 fatty acids 2 g, Daily   Glucos-Chondroit-Hyaluron-MSM (GLUCOSAMINE CHONDROITIN JOINT PO) 1,200 mg, Daily   promethazine-dextromethorphan (PROMETHAZINE-DM) 6.25-15 MG/5ML syrup 5 mLs, Oral, 4 times daily PRN       Objective:   Physical Exam BP 126/62   Pulse 62   Temp 98.2 F (36.8 C) (Oral)   Resp 16   Ht 5' 10"$  (1.778 m)   Wt 201 lb 6 oz (91.3 kg)   SpO2 97%   BMI 28.89 kg/m  General:   Well developed, NAD, BMI noted.  HEENT:  Normocephalic . Face symmetric, atraumatic abdomen:  Not distended, soft, non-tender. No rebound or rigidity. Groins: No hernias.  No pathological LAD's. GU: Testicles symmetric, nontender. Penis: No lesions, no discharge DRE: Normal external examination, prostate normal, no anal rectal mass, no stools found Skin: Not pale. Not jaundice Lower extremities: no pretibial edema bilaterally  Neurologic:  alert & oriented X3.   Speech normal, gait appropriate for age and unassisted Psych--  Cognition and judgment appear intact.  Cooperative with normal attention span and concentration.  Behavior appropriate. No anxious or depressed appearing.     Assessment     Assessment HTN Hyperlipidemia Chest pain : (-) stress test 2008, 2010 Migraines FH CAD: F, GF  PLAN  Groin and scrotal pain: As described above, symptoms are atypical, no red flags, physical exam is benign. U dip: Question of blood.  Will send a UA urine culture Otherwise recommend observation.  If symptoms persist he will let me know, scrotal US?  Further testing?Marland Kitchen

## 2022-03-18 NOTE — Patient Instructions (Addendum)
Give herself few more days  If your symptoms continue let me know  Self-exam the scrotum, if you feel your testicles  are swollen or tender let me know.       GO TO THE FRONT DESK, PLEASE SCHEDULE YOUR APPOINTMENTS Come back for   a physical exam by April

## 2022-03-18 NOTE — Assessment & Plan Note (Signed)
Groin and scrotal pain: As described above, symptoms are atypical, no red flags, physical exam is benign. U dip: Question of blood.  Will send a UA urine culture Otherwise recommend observation.  If symptoms persist he will let me know, scrotal US?  Further testing?Marland Kitchen

## 2022-03-19 LAB — URINE CULTURE
MICRO NUMBER:: 14626339
Result:: NO GROWTH
SPECIMEN QUALITY:: ADEQUATE

## 2022-04-02 ENCOUNTER — Telehealth: Payer: BC Managed Care – PPO | Admitting: Physician Assistant

## 2022-04-02 DIAGNOSIS — H8110 Benign paroxysmal vertigo, unspecified ear: Secondary | ICD-10-CM | POA: Diagnosis not present

## 2022-04-02 MED ORDER — FLUTICASONE PROPIONATE 50 MCG/ACT NA SUSP: 2.0000 | Freq: Every day | NASAL | 0 refills | Status: AC

## 2022-04-02 MED ORDER — ONDANSETRON 4 MG PO TBDP: 4.0000 mg | ORAL_TABLET | Freq: Three times a day (TID) | ORAL | 0 refills | Status: DC | PRN

## 2022-04-02 MED ORDER — MECLIZINE HCL 25 MG PO TABS: 25.0000 mg | ORAL_TABLET | Freq: Three times a day (TID) | ORAL | 0 refills | Status: DC | PRN

## 2022-04-02 NOTE — Patient Instructions (Signed)
Marcus Santiago, thank you for joining Leeanne Rio, PA-C for today's virtual visit.  While this provider is not your primary care provider (PCP), if your PCP is located in our provider database this encounter information will be shared with them immediately following your visit.   [place] account gives you access to today's visit and all your visits, tests, and labs performed at [facility] " click here if you don't have a [place] account or go to [email or web address]  Consent: (Patient) Marcus Santiago provided verbal consent for this virtual visit at the beginning of the encounter.  Current Medications:  Current Outpatient Medications:    aspirin 81 MG tablet, Take 81 mg by mouth daily., Disp: , Rfl:    cyclobenzaprine (FLEXERIL) 10 MG tablet, Take 1 tablet (10 mg total) by mouth 2 (two) times daily as needed for muscle spasms. (Patient not taking: Reported on 03/18/2022), Disp: 20 tablet, Rfl: 0   fish oil-omega-3 fatty acids 1000 MG capsule, Take 2 g by mouth daily. (Patient not taking: Reported on 03/18/2022), Disp: , Rfl:    Glucos-Chondroit-Hyaluron-MSM (GLUCOSAMINE CHONDROITIN JOINT PO), Take 1,200 mg by mouth daily at 6 (six) AM. (Patient not taking: Reported on 03/18/2022), Disp: , Rfl:    promethazine-dextromethorphan (PROMETHAZINE-DM) 6.25-15 MG/5ML syrup, Take 5 mLs by mouth 4 (four) times daily as needed for cough. (Patient not taking: Reported on 03/18/2022), Disp: 240 mL, Rfl: 0   Medications ordered in this encounter:  No orders of the defined types were placed in this encounter.    *If you need refills on other medications prior to your next appointment, please contact your pharmacy*  Follow-Up: Call back or seek an in-person evaluation if the symptoms worsen or if the condition fails to improve as anticipated.  [place] [phone]  Other Instructions Please keep well-hydrated and try to rest. Start  the Flonase nasal spray.  I recommend OTC Claritin as well. Zofran is to use as directed if needed for nausea. The meclizine is to have on hand if there are any recurring episodes of positional dizziness. Follow dietary recommendations below. Also follow the exercises listed below. Follow-up in person with your primary care provider if symptoms or not resolving or if you note any new or worsening symptoms.  Bland Diet A bland diet may consist of soft foods or foods that are not high in fat or are not greasy, acidic, or spicy. Avoiding certain foods may cause less irritation to your mouth, throat, stomach, or gastrointestinal tract. Avoiding certain foods may make you feel better. Everyone's tolerances are different. A bland diet should be based on what you can tolerate and what may cause discomfort. What is my plan? Your health care provider or dietitian may recommend specific changes to your diet to treat your symptoms. These changes may include: Eating small meals frequently. Cooking food until it is soft enough to chew easily. Taking the time to chew your food thoroughly, so it is easy to swallow and digest. Avoiding foods that cause you discomfort. These may include spicy food, fried food, greasy foods, hard-to-chew foods, or citrus fruits and juices. Drinking slowly. What are tips for following this plan? Reading food labels To reduce fiber intake, look for food labels that say "whole," such as whole wheat or whole grain. Shopping Avoid food items that may have nuts or seeds. Avoid vegetables that may make you gassy or have a tough texture, such as broccoli, cauliflower, or corn.  Cooking Cook foods thoroughly so they have a soft texture. Meal planning Make sure you include foods from all food groups to eat a balanced diet. Eat a variety of types of foods. Eat foods and drink beverages that do not cause you discomfort. These may include soups and broths with cooked meats, pasta, and  vegetables. Lifestyle Sit up after meals, avoid tight clothing, and take time to eat and chew your food slowly. Ask your health care provider whether you should take dietary supplements. General information Mildly season your foods. Some seasonings, such as cayenne pepper, vinegar, or hot sauce, may cause irritation. The foods, beverages, or seasonings to avoid should be based on individual tolerance. What foods should I eat? Fruits Canned or cooked fruit such as peaches, pears, or applesauce. Bananas. Vegetables Well-cooked vegetables. Canned or cooked vegetables such as carrots, green beans, beets, or spinach. Mashed or boiled potatoes. Grains  Hot cereals, such as cream of wheat and processed oatmeal. Rice. Bread, crackers, pasta, or tortillas made from refined white flour. Meats and other proteins  Eggs. Creamy peanut butter or other nut butters. Lean, well-cooked tender meats, such as beef, pork, chicken, or fish. Dairy Low-fat dairy products such as milk, cottage cheese, or yogurt. Beverages  Water. Herbal tea. Apple juice. Fats and oils Mild salad dressings. Canola or olive oil. Sweets and desserts Low-fat pudding, custard, or ice cream. Fruit gelatin. The items listed above may not be a complete list of foods and beverages you can eat. Contact a dietitian for more information. What foods should I avoid? Fruits Citrus fruits, such as oranges and grapefruit. Fruits with a stringy texture. Fruits that have lots of seeds, such as kiwi or strawberries. Dried fruits. Vegetables Raw, uncooked vegetables. Salads. Grains Whole grain breads, muffins, and cereals. Meats and other proteins Tough, fibrous meats. Highly seasoned meat such as corned beef, smoked meats, or fish. Processed high-fat meats such as brats, hot dogs, or sausage. Dairy Full-fat dairy foods such as ice cream and cheese. Beverages Caffeinated drinks. Alcohol. Seasonings and condiments Strongly flavored  seasonings or condiments. Hot sauce. Salsa. Other foods Spicy foods. Fried or greasy foods. Sour foods, such as pickled or fermented foods like sauerkraut. Foods high in fiber. The items listed above may not be a complete list of foods and beverages you should avoid. Contact a dietitian for more information. Summary A bland diet should be based on individual tolerance. It may consist of foods that are soft textured and do not have a lot of fat, fiber, acid, or seasonings. A bland diet may be recommended because avoiding certain foods, beverages, or spices may make you feel better. This information is not intended to replace advice given to you by your health care provider. Make sure you discuss any questions you have with your health care provider. Document Revised: 11/25/2020 Document Reviewed: 11/25/2020 Elsevier Patient Education  [place].  How to Perform the Epley Maneuver The Epley maneuver is an exercise that relieves symptoms of vertigo. Vertigo is the feeling that you or your surroundings are moving when they are not. When you feel vertigo, you may feel like the room is spinning and may have trouble walking. The Epley maneuver is used for a type of vertigo caused by a calcium deposit in a part of the inner ear. The maneuver involves changing head positions to help the deposit move out of the area. You can do this maneuver at home whenever you have symptoms of vertigo. You can repeat it  in 24 hours if your vertigo has not gone away. Even though the Epley maneuver may relieve your vertigo for a few weeks, it is possible that your symptoms will return. This maneuver relieves vertigo, but it does not relieve dizziness. What are the risks? If it is done correctly, the Epley maneuver is considered safe. Sometimes it can lead to dizziness or nausea that goes away after a short time. If you develop other symptoms--such as changes in vision, weakness, or numbness--stop doing the maneuver  and call your health care provider. Supplies needed: A bed or table. A pillow. How to do the Epley maneuver     Sit on the edge of a bed or table with your back straight and your legs extended or hanging over the edge of the bed or table. Turn your head halfway toward the affected ear or side as told by your health care provider. Lie backward quickly with your head turned until you are lying flat on your back. Your head should dangle (head-hanging position). You may want to position a pillow under your shoulders. Hold this position for at least 30 seconds. If you feel dizzy or have symptoms of vertigo, continue to hold the position until the symptoms stop. Turn your head to the opposite direction until your unaffected ear is facing down. Your head should continue to dangle. Hold this position for at least 30 seconds. If you feel dizzy or have symptoms of vertigo, continue to hold the position until the symptoms stop. Turn your whole body to the same side as your head so that you are positioned on your side. Your head will now be nearly facedown and no longer needs to dangle. Hold for at least 30 seconds. If you feel dizzy or have symptoms of vertigo, continue to hold the position until the symptoms stop. Sit back up. You can repeat the maneuver in 24 hours if your vertigo does not go away. Follow these instructions at home: For 24 hours after doing the Epley maneuver: Keep your head in an upright position. When lying down to sleep or rest, keep your head raised (elevated) with two or more pillows. Avoid excessive neck movements. Activity Do not drive or use machinery if you feel dizzy. After doing the Epley maneuver, return to your normal activities as told by your health care provider. Ask your health care provider what activities are safe for you. General instructions Drink enough fluid to keep your urine pale yellow. Do not drink alcohol. Take over-the-counter and prescription  medicines only as told by your health care provider. Keep all follow-up visits. This is important. Preventing vertigo symptoms Ask your health care provider if there is anything you should do at home to prevent vertigo. He or she may recommend that you: Keep your head elevated with two or more pillows while you sleep. Do not sleep on the side of your affected ear. Get up slowly from bed. Avoid sudden movements during the day. Avoid extreme head positions or movement, such as looking up or bending over. Contact a health care provider if: Your vertigo gets worse. You have other symptoms, including: Nausea. Vomiting. Headache. Get help right away if you: Have vision changes. Have a headache or neck pain that is severe or getting worse. Cannot stop vomiting. Have new numbness or weakness in any part of your body. These symptoms may represent a serious problem that is an emergency. Do not wait to see if the symptoms will go away. Get medical help right  away. Call your local emergency services (911 in the [place] Do not drive yourself to the hospital. Summary Vertigo is the feeling that you or your surroundings are moving when they are not. The Epley maneuver is an exercise that relieves symptoms of vertigo. If the Epley maneuver is done correctly, it is considered safe. This information is not intended to replace advice given to you by your health care provider. Make sure you discuss any questions you have with your health care provider. Document Revised: 12/06/2019 Document Reviewed: 12/06/2019 Elsevier Patient Education  [place].   If you have been instructed to have an in-person evaluation today at a local Urgent Care facility, please use the link below. It will take you to a list of all of our available [facility], including address, phone number and hours of operation. Please do not delay care.  [facility]  If you or a family member do not have a  primary care provider, use the link below to schedule a visit and establish care. When you choose a [facility] primary care physician or advanced practice provider, you gain a long-term partner in health. Find a Primary Care Provider  Learn more about [facility]'s in-office and virtual care options: [facility]

## 2022-04-02 NOTE — Progress Notes (Signed)
Virtual Visit Consent   Marcus Santiago, you are scheduled for a virtual visit with a [facility] provider today. Just as with appointments in the office, your consent must be obtained to participate. Your consent will be active for this visit and any virtual visit you may have with one of our providers in the next 365 days. If you have a MyChart account, a copy of this consent can be sent to you electronically.  As this is a virtual visit, video technology does not allow for your provider to perform a traditional examination. This may limit your provider's ability to fully assess your condition. If your provider identifies any concerns that need to be evaluated in person or the need to arrange testing (such as labs, EKG, etc.), we will make arrangements to do so. Although advances in technology are sophisticated, we cannot ensure that it will always work on either your end or our end. If the connection with a video visit is poor, the visit may have to be switched to a telephone visit. With either a video or telephone visit, we are not always able to ensure that we have a secure connection.  By engaging in this virtual visit, you consent to the provision of healthcare and authorize for your insurance to be billed (if applicable) for the services provided during this visit. Depending on your insurance coverage, you may receive a charge related to this service.  I need to obtain your verbal consent now. Are you willing to proceed with your visit today? Marcus Santiago has provided verbal consent on 04/02/2022 for a virtual visit (video or telephone). Marcus Santiago, [place]  Date: 04/02/2022 12:55 PM  Virtual Visit via Video Note   I, Marcus Santiago, connected with  Marcus Santiago  ([id number], [birth year]) on 04/02/22 at 12:15 PM EDT by a video-enabled telemedicine application and verified that I am speaking with the correct person using two identifiers.  Location: Patient: Virtual Visit  Location Patient: Home Provider: Virtual Visit Location Provider: Home Office   I discussed the limitations of evaluation and management by telemedicine and the availability of in person appointments. The patient expressed understanding and agreed to proceed.    History of Present Illness: Marcus Santiago is a 57 y.o. who identifies as a male who was assigned male at birth, and is being seen today for  some intermittent dizziness associated with, starting last night when getting home from work.  Last night overnight he noted dizziness with positional changes (turning to the side) associated with some nausea and stomach upset.  Noted on waking this morning still with nausea and had an episode of non-bloody emesis around 7 AM. None since then. Was able to go back to sleep and rest for a bit. No further dizziness with position change but still with nausea. Notes normal bowel movements. Mild headache at present.   Denies fever. Denies recent travel.   HPI: HPI  Problems:  Patient Active Problem List   Diagnosis Date Noted   COVID-19 virus infection 07/17/2020   History of sensory changes 11/07/2018   Primary headache associated with sexual activity 11/07/2018   Primary thunderclap headache 11/07/2018   PCP NOTES >>> 11/07/2014   Annual physical exam 07/04/2012   HYPERLIPIDEMIA 10/25/2007   Migraine without aura 10/15/2006   Essential hypertension 08/30/2006    Allergies: No Known Allergies Medications:  Current Outpatient Medications:    fluticasone (FLONASE) 50 MCG/ACT nasal spray, Place 2 sprays into both nostrils daily.,  Disp: 16 g, Rfl: 0   meclizine (ANTIVERT) 25 MG tablet, Take 1 tablet (25 mg total) by mouth 3 (three) times daily as needed for dizziness., Disp: 30 tablet, Rfl: 0   ondansetron (ZOFRAN-ODT) 4 MG disintegrating tablet, Take 1 tablet (4 mg total) by mouth every 8 (eight) hours as needed for nausea or vomiting., Disp: 20 tablet, Rfl: 0   aspirin 81 MG tablet, Take 81 mg  by mouth daily., Disp: , Rfl:   Observations/Objective: Patient is well-developed, well-nourished in no acute distress.  Resting comfortably at home.  Head is normocephalic, atraumatic.  No labored breathing. Speech is clear and coherent with logical content.  Patient is alert and oriented at baseline.   Assessment and Plan: 1. Benign paroxysmal positional vertigo, unspecified laterality - ondansetron (ZOFRAN-ODT) 4 MG disintegrating tablet; Take 1 tablet (4 mg total) by mouth every 8 (eight) hours as needed for nausea or vomiting.  Dispense: 20 tablet; Refill: 0 - fluticasone (FLONASE) 50 MCG/ACT nasal spray; Place 2 sprays into both nostrils daily.  Dispense: 16 g; Refill: 0 - meclizine (ANTIVERT) 25 MG tablet; Take 1 tablet (25 mg total) by mouth 3 (three) times daily as needed for dizziness.  Dispense: 30 tablet; Refill: 0  Positional change as cause -- mainly turning to left. Some associated ear pressure now without other URI symptoms. Nausea and vomiting secondary to dizzy spells. Supportive measures and OTC medications reviewed. Start flonase, zofran and meclizine. Epley maneuvers reviewed. In person follow-up if not resolving or any new/worsening symptoms despite treatment.   Follow Up Instructions: I discussed the assessment and treatment plan with the patient. The patient was provided an opportunity to ask questions and all were answered. The patient agreed with the plan and demonstrated an understanding of the instructions.  A copy of instructions were sent to the patient via MyChart unless otherwise noted below.   The patient was advised to call back or seek an in-person evaluation if the symptoms worsen or if the condition fails to improve as anticipated.  Time:  I spent 10 minutes with the patient via telehealth technology discussing the above problems/concerns.    Marcus Rio, PA-C

## 2022-05-11 ENCOUNTER — Ambulatory Visit (INDEPENDENT_AMBULATORY_CARE_PROVIDER_SITE_OTHER): Payer: BC Managed Care – PPO | Admitting: Internal Medicine

## 2022-05-11 ENCOUNTER — Encounter: Payer: Self-pay | Admitting: Internal Medicine

## 2022-05-11 VITALS — BP 166/90 | HR 62 | Temp 98.5°F | Resp 16 | Ht 70.0 in | Wt 202.5 lb

## 2022-05-11 DIAGNOSIS — J029 Acute pharyngitis, unspecified: Secondary | ICD-10-CM

## 2022-05-11 DIAGNOSIS — R42 Dizziness and giddiness: Secondary | ICD-10-CM

## 2022-05-11 DIAGNOSIS — Z0001 Encounter for general adult medical examination with abnormal findings: Secondary | ICD-10-CM | POA: Diagnosis not present

## 2022-05-11 DIAGNOSIS — R030 Elevated blood-pressure reading, without diagnosis of hypertension: Secondary | ICD-10-CM | POA: Diagnosis not present

## 2022-05-11 DIAGNOSIS — Z0000 Encounter for general adult medical examination without abnormal findings: Secondary | ICD-10-CM

## 2022-05-11 DIAGNOSIS — E785 Hyperlipidemia, unspecified: Secondary | ICD-10-CM

## 2022-05-11 DIAGNOSIS — Z125 Encounter for screening for malignant neoplasm of prostate: Secondary | ICD-10-CM

## 2022-05-11 DIAGNOSIS — R739 Hyperglycemia, unspecified: Secondary | ICD-10-CM

## 2022-05-11 NOTE — Patient Instructions (Addendum)
Watch her salt intake closely  Check the  blood pressure regularly BP GOAL is between 110/65 and  135/85. If it is consistently higher or lower, let me know  For allergies, take Allegra 60 mg 1 tablet twice daily as needed. Avoid any decongestants  GO TO THE LAB : Get the blood work     GO TO THE FRONT DESK, PLEASE SCHEDULE YOUR APPOINTMENTS Come back for   a checkup in 3 months.    Vaccines I recommend:  Shingrix (shingles)

## 2022-05-11 NOTE — Progress Notes (Unsigned)
Subjective:    Patient ID: Marcus Santiago, male    DOB: 1965/03/01, 57 y.o.   MRN: [id number]  DOS:  05/11/2022 Type of visit - description: cpx  Here for CPX  BP noted to be elevated.  Normal [facility]ia BPs. Also for the last few weeks had his typical hayfever symptoms (sneezing, itchy nose) but last week developed sore throat, at times severe.  No objective fever. At this point he feels better.  Also having very short episodes of mild dizziness with head turning standing up fast.  No LOC, no headaches, no chest pain ; mild palpitations, not a new sx, but thinks related to allergies b/c sxs started < 2 weeks ago      Review of Systems See above   Past Medical History:  Diagnosis Date   Allergy    Chest pain    (-) stress test 2008, 2010   Headache    Hyperlipemia    Hypertension     Past Surgical History:  Procedure Laterality Date   KNEE ARTHROSCOPY     L x 2    Current Outpatient Medications  Medication Instructions   aspirin 81 mg, Daily   fluticasone (FLONASE) 50 MCG/ACT nasal spray 2 sprays, Each Nare, Daily   meclizine (ANTIVERT) 25 mg, Oral, 3 times daily PRN   ondansetron (ZOFRAN-ODT) 4 mg, Oral, Every 8 hours PRN       Objective:   Physical Exam BP (!) 162/94   Pulse 62   Temp 98.5 F (36.9 C) (Oral)   Resp 16   Ht  (1.778 m)   Wt 202 lb 8 oz (91.9 kg)   SpO2 97%   BMI 29.06 kg/m  General: Well developed, NAD, BMI noted Neck: Normal carotid pulses HEENT:  Normocephalic . Face symmetric, atraumatic. Nose slightly congested, throat is slightly red but symmetric, no white patches Lungs:  CTA B Normal respiratory effort, no intercostal retractions, no accessory muscle use. Heart: RRR,  no murmur.  Abdomen:  Not distended, soft, non-tender. No rebound or rigidity.   Lower extremities: no pretibial edema bilaterally  Skin: Exposed areas without rash. Not pale. Not jaundice Neurologic:  alert & oriented X3.  Speech normal, gait  appropriate for age and unassisted. EOMI. Strength symmetric and appropriate for age.  Psych: Cognition and judgment appear intact.  Cooperative with normal attention span and concentration.  Behavior appropriate. No anxious or depressed appearing.     Assessment   Assessment HTN Hyperlipidemia Chest pain : (-) stress test 2008, 2010 Migraines FH CAD: F, GF  PLAN  Here for CPX HTN: h/o, Self stopped lisinopril about 01-2021, no recent ambulatory BPs. Plan: Restart BP meds, recommend amlodipine 5 mg.  Labs.  Check BPs and let me know results. Addendum: Very hesitant to take medication, consequently we agree on a low-salt diet, exercise check at home and return in 3 months.  See AVS Allergies: Getting better, did report recent sore throat, throat is slightly red, will check throat culture and treat accordingly.  Also recommend Allegra  . Dizziness: As described above, likely benign, recommend duration Groin and scrotal pain: See LOV, resolved FH CAD: Self stop aspirin, recommend to restart RTC 3 months mostly to check BPs.  -Td 2018 - shingrix d/w pt  -  covid vaccines utd (12-2021 per pt) -- + FH prostate ca (uncle); See OV from 02-2022  DRE was normal at that time. --CCS: Never had a colonoscopy, no FH, (-) IFOB 12/2017.  (-)  Cologuard 02/2020  --Diet and exercise: Encouraged a low-salt diet and 3 hours of exercise a week. --Labs:CMP FLP CBC A1c PSA

## 2022-05-12 ENCOUNTER — Encounter: Payer: Self-pay | Admitting: Internal Medicine

## 2022-05-12 LAB — LIPID PANEL
Cholesterol: 217 mg/dL — ABNORMAL HIGH (ref 0–200)
HDL: 36.7 mg/dL — ABNORMAL LOW (ref >39.00)
NonHDL: 179.9
Total CHOL/HDL Ratio: 6
Triglycerides: 334 mg/dL — ABNORMAL HIGH (ref 0.0–149.0)
VLDL: 66.8 mg/dL — ABNORMAL HIGH (ref 0.0–40.0)

## 2022-05-12 LAB — COMPREHENSIVE METABOLIC PANEL
ALT: 15 U/L (ref 0–53)
AST: 18 U/L (ref 0–37)
Albumin: 4.6 g/dL (ref 3.5–5.2)
Alkaline Phosphatase: 65 U/L (ref 39–117)
BUN: 17 mg/dL (ref 6–23)
CO2: 29 mEq/L (ref 19–32)
Calcium: 9.3 mg/dL (ref 8.4–10.5)
Chloride: 102 mEq/L (ref 96–112)
Creatinine, Ser: 1.02 mg/dL (ref 0.40–1.50)
GFR: 83.4 mL/min (ref >60.00)
Glucose, Bld: 114 mg/dL — ABNORMAL HIGH (ref 70–99)
Potassium: 3.7 mEq/L (ref 3.5–5.1)
Sodium: 140 mEq/L (ref 135–145)
Total Bilirubin: 1 mg/dL (ref 0.2–1.2)
Total Protein: 7.6 g/dL (ref 6.0–8.3)

## 2022-05-12 LAB — CBC WITH DIFFERENTIAL/PLATELET
Basophils Absolute: 0 10*3/uL (ref 0.0–0.1)
Basophils Relative: 0.4 % (ref 0.0–3.0)
Eosinophils Absolute: 0.1 10*3/uL (ref 0.0–0.7)
Eosinophils Relative: 1.6 % (ref 0.0–5.0)
HCT: 45.6 % (ref 39.0–52.0)
Hemoglobin: 15.6 g/dL (ref 13.0–17.0)
Lymphocytes Relative: 18.5 % (ref 12.0–46.0)
Lymphs Abs: 1.4 10*3/uL (ref 0.7–4.0)
MCHC: 34.2 g/dL (ref 30.0–36.0)
MCV: 84.6 fl (ref 78.0–100.0)
Monocytes Absolute: 0.5 10*3/uL (ref 0.1–1.0)
Monocytes Relative: 6.8 % (ref 3.0–12.0)
Neutro Abs: 5.5 10*3/uL (ref 1.4–7.7)
Neutrophils Relative %: 72.7 % (ref 43.0–77.0)
Platelets: 301 10*3/uL (ref 150.0–400.0)
RBC: 5.39 Mil/uL (ref 4.22–5.81)
RDW: 13.5 % (ref 11.5–15.5)
WBC: 7.6 10*3/uL (ref 4.0–10.5)

## 2022-05-12 LAB — HEMOGLOBIN A1C: Hgb A1c MFr Bld: 5.8 % (ref 4.6–6.5)

## 2022-05-12 LAB — LDL CHOLESTEROL, DIRECT: Direct LDL: 126 mg/dL

## 2022-05-12 LAB — PSA: PSA: 0.69 ng/mL (ref 0.10–4.00)

## 2022-05-12 NOTE — Assessment & Plan Note (Signed)
-  Td 2018 - shingrix d/w pt  -  covid vaccines utd (12-2021 per pt) -- + FH prostate ca (uncle); See OV from 02-2022  DRE was normal at that time. PSA. --CCS: Never had a colonoscopy, no FH, (-) IFOB 12/2017.  (-) Cologuard 02/2020  --Diet and exercise: Encouraged a low-salt diet and 3 hours of exercise a week. --Labs:CMP FLP CBC A1c PSA

## 2022-05-12 NOTE — Assessment & Plan Note (Signed)
Here for CPX HTN: h/o, Self stopped lisinopril about 01-2021, no recent ambulatory BPs. Plan:  Restart BP meds, recommend amlodipine 5 mg.  Labs.  Check BPs and let me know results. Addendum: Very hesitant to take medication, consequently we agree on a low-salt diet, exercise, check amb BPS, RTC 3 months.  See AVS Allergies: Getting better, did report recent sore throat, throat is slightly red, will check throat culture and treat accordingly.  Also recommend Allegra  Dizziness: As described above, likely benign, recommend obs. Groin and scrotal pain: See LOV, resolved FH CAD: Self stopped aspirin, recommend to restart RTC 3 months mostly to check BPs.

## 2022-05-13 LAB — CULTURE, GROUP A STREP
MICRO NUMBER:: 14856014
SPECIMEN QUALITY:: ADEQUATE

## 2022-05-14 ENCOUNTER — Encounter: Payer: Self-pay | Admitting: Internal Medicine

## 2022-06-16 ENCOUNTER — Ambulatory Visit: Payer: BC Managed Care – PPO | Admitting: Family

## 2022-06-16 VITALS — BP 177/97 | HR 81 | Temp 98.3°F | Resp 16 | Wt 202.0 lb

## 2022-06-16 DIAGNOSIS — H8110 Benign paroxysmal vertigo, unspecified ear: Secondary | ICD-10-CM | POA: Diagnosis not present

## 2022-06-16 DIAGNOSIS — I10 Essential (primary) hypertension: Secondary | ICD-10-CM

## 2022-06-16 MED ORDER — LISINOPRIL 20 MG PO TABS: 20.0000 mg | ORAL_TABLET | Freq: Every day | ORAL | 0 refills | Status: DC

## 2022-06-16 NOTE — Progress Notes (Signed)
Subjective:   By signing my name below, I, Marcus Santiago, attest that this documentation has been prepared under the direction and in the presence of Lemont Fillers, NP 06/18/22   Patient ID: Marcus Santiago, male    DOB: 1965/10/02, 57 y.o.   MRN: [id number]  Chief Complaint  Patient presents with   Dizziness    Still having vertigo symptoms    HPI Patient is in today for vertigo.  Vertigo: has been having positional vertigo since 05/04/22. Happens upon turning or standing from bent position. Meclizine makes him drowsy.  Hypertension: blood pressure elevated today at 177/97. Not currently taking blood pressure medication. 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  Past Medical History:  Diagnosis Date   Allergy    Chest pain    (-) stress test 2008, 2010   Headache    Hyperlipemia    Hypertension     Past Surgical History:  Procedure Laterality Date   KNEE ARTHROSCOPY     L x 2    Family History  Problem Relation Age of Onset   Lupus Mother    CAD Father 35       F first MI ~ 2, GF, uncle    Hyperlipidemia Father    Hypertension Father    Diabetes Paternal Grandmother    Heart disease Paternal Grandfather    Stroke Paternal Grandfather    Prostate cancer Paternal Uncle        uncle   Stroke Other        GF   Colon cancer Neg Hx     Social History   Socioeconomic History   Marital status: Married    Spouse name: Not on file   Number of children: 0   Years of education: Not on file   Highest education level: Not on file  Occupational History   Occupation: tax preparation   Tobacco Use   Smoking status: Never   Smokeless tobacco: Never  Vaping Use   Vaping Use: Never used  Substance and Sexual Activity   Alcohol use: Yes    Comment: rare   Drug use: No   Sexual activity: Yes    Partners: Female    Birth control/protection: Surgical  Other  Topics Concern   Not on file  Social History Narrative   Household- pt and wife   Social Determinants of Health   Financial Resource Strain: Not on file  Food Insecurity: Not on file  Transportation Needs: Not on file  Physical Activity: Not on file  Stress: Not on file  Social Connections: Not on file  Intimate Partner Violence: Not on file    Outpatient Medications Prior to Visit  Medication Sig Dispense Refill   aspirin 81 MG tablet Take 81 mg by mouth daily.     fluticasone (FLONASE) 50 MCG/ACT nasal spray Place 2 sprays into both nostrils daily. 16 g 0   No facility-administered medications prior to visit.    No Known Allergies  Review of Systems  Constitutional:  Negative for fever and malaise/fatigue.  HENT:  Negative for congestion.   Eyes:  Negative for blurred vision.  Respiratory:  Negative for cough and shortness of breath.   Cardiovascular:  Negative for chest pain, palpitations and leg swelling.  Gastrointestinal:  Negative for vomiting.  Musculoskeletal:  Negative for back pain.  Skin:  Negative for rash.  Neurological:  Positive for dizziness. Negative for loss of consciousness and headaches.       Objective:    Physical Exam Vitals and nursing note reviewed.  Constitutional:      Appearance: He is well-developed.  HENT:     Head: Normocephalic.  Eyes:     Conjunctiva/sclera: Conjunctivae normal.     Pupils: Pupils are equal, round, and reactive to light.  Pulmonary:     Effort: Pulmonary effort is normal.  Musculoskeletal:        General: Normal range of motion.     Cervical back: Normal range of motion.  Skin:    General: Skin is warm and dry.  Neurological:     Mental Status: He is alert and oriented to person, place, and time.     Comments: Dix-Hallpike test negative bilaterally.  Psychiatric:        Behavior: Behavior normal.        Thought Content: Thought content normal.        Judgment: Judgment normal.     BP (!) 177/97 (BP  Location: Right Arm, Patient Position: Sitting, Cuff Size: Small)   Pulse 81   Temp 98.3 F (36.8 C) (Oral)   Resp 16   Wt 202 lb (91.6 kg)   BMI 28.98 kg/m  Wt Readings from Last 3 Encounters:  06/16/22 202 lb (91.6 kg)  05/11/22 202 lb 8 oz (91.9 kg)  03/18/22 201 lb 6 oz (91.3 kg)       Assessment & Plan:  Essential hypertension Assessment & Plan: Uncontrolled. He reports that he has been working hard on his diet/exercise since his last visit. Unfortunately his bp is higher today. Dad had HTN. We discussed hereditary component.  BP Readings from Last 3 Encounters:  06/16/22 (!) 177/97  05/11/22 (!) 166/90  03/18/22 126/62   Will initiate lisinopril 20mg  once daily which he has been on in the past.   Orders: -     Lisinopril; Take 1 tablet (20 mg total) by mouth daily.  Dispense: 90 tablet; Refill: 0  Benign paroxysmal positional vertigo, unspecified laterality Assessment & Plan: While his vertigo testing is negative, his symptoms come and go and he is not currently symptomatic.  Hx most consistent with BPPV.  He found that meclizine made him too sleepy.  We discussed vestibular rehab and he is interested in proceeding.   Orders: -     Ambulatory referral to Physical Therapy     I,Alexander Ruley,acting as a scribe for Lemont Fillers, NP.,have documented all relevant documentation on the behalf of Lemont Fillers, NP,as directed by  Lemont Fillers, NP while in the presence of Lemont Fillers, NP.

## 2022-06-16 NOTE — Assessment & Plan Note (Signed)
Uncontrolled. He reports that he has been working hard on his diet/exercise since his last visit. Unfortunately his bp is higher today. Dad had HTN. We discussed hereditary component.  BP Readings from Last 3 Encounters:  06/16/22 (!) 177/97  05/11/22 (!) 166/90  03/18/22 126/62   Will initiate lisinopril 20mg  once daily which he has been on in the past.

## 2022-06-18 DIAGNOSIS — H8110 Benign paroxysmal vertigo, unspecified ear: Secondary | ICD-10-CM | POA: Insufficient documentation

## 2022-06-18 NOTE — Assessment & Plan Note (Signed)
While his vertigo testing is negative, his symptoms come and go and he is not currently symptomatic.  Hx most consistent with BPPV.  He found that meclizine made him too sleepy.  We discussed vestibular rehab and he is interested in proceeding.

## 2022-06-23 DIAGNOSIS — M2569 Stiffness of other specified joint, not elsewhere classified: Secondary | ICD-10-CM | POA: Diagnosis not present

## 2022-06-23 DIAGNOSIS — R42 Dizziness and giddiness: Secondary | ICD-10-CM | POA: Diagnosis not present

## 2022-06-23 DIAGNOSIS — M542 Cervicalgia: Secondary | ICD-10-CM | POA: Diagnosis not present

## 2022-06-24 ENCOUNTER — Ambulatory Visit (INDEPENDENT_AMBULATORY_CARE_PROVIDER_SITE_OTHER): Payer: BC Managed Care – PPO

## 2022-06-24 VITALS — BP 138/86 | HR 71

## 2022-06-24 DIAGNOSIS — I1A0 Resistant hypertension: Secondary | ICD-10-CM | POA: Diagnosis not present

## 2022-06-24 NOTE — Progress Notes (Signed)
Pt here for Blood pressure check per     Pt currently takes: lisinopril (ZESTRIL) 20 MG tablet  BP Readings from Last 3 Encounters:  06/16/22 (!) 177/97  05/11/22 (!) 166/90  03/18/22 126/62    Pt reports compliance with medication.  BP today @ = 140/82 RA 138/86 LA HR =71  Pt advised per  Dr.Paz Keep taking same Medication, Check BP and bring reading in at next visit  08/10/22 with Dr.Paz

## 2022-06-25 ENCOUNTER — Telehealth: Payer: Self-pay

## 2022-06-25 ENCOUNTER — Other Ambulatory Visit: Payer: Self-pay

## 2022-06-25 DIAGNOSIS — I10 Essential (primary) hypertension: Secondary | ICD-10-CM

## 2022-06-25 NOTE — Telephone Encounter (Signed)
I asked for a BMP. If you did not order it, asked the patient to come back for blood work  1  Wed 4:32 PM JP Gm Dr.Paz called the pt he is gone out town but we did get him setup for when he returns for lab appt.

## 2022-06-25 NOTE — Progress Notes (Signed)
Needs a BMP 

## 2022-07-13 ENCOUNTER — Telehealth: Payer: BC Managed Care – PPO | Admitting: Family Medicine

## 2022-07-13 DIAGNOSIS — R197 Diarrhea, unspecified: Secondary | ICD-10-CM

## 2022-07-13 DIAGNOSIS — J22 Unspecified acute lower respiratory infection: Secondary | ICD-10-CM | POA: Diagnosis not present

## 2022-07-13 DIAGNOSIS — U071 COVID-19: Secondary | ICD-10-CM

## 2022-07-13 MED ORDER — AMOXICILLIN-POT CLAVULANATE 875-125 MG PO TABS
1.0000 | ORAL_TABLET | Freq: Two times a day (BID) | ORAL | 0 refills | Status: AC
Start: 2022-07-13 — End: 2022-07-20

## 2022-07-13 MED ORDER — BENZONATATE 100 MG PO CAPS
100.0000 mg | ORAL_CAPSULE | Freq: Two times a day (BID) | ORAL | 0 refills | Status: DC | PRN
Start: 2022-07-13 — End: 2022-08-05

## 2022-07-13 MED ORDER — PROMETHAZINE-DM 6.25-15 MG/5ML PO SYRP
5.0000 mL | ORAL_SOLUTION | Freq: Four times a day (QID) | ORAL | 0 refills | Status: DC | PRN
Start: 2022-07-13 — End: 2022-08-05

## 2022-07-13 MED ORDER — ALBUTEROL SULFATE HFA 108 (90 BASE) MCG/ACT IN AERS
1.0000 | INHALATION_SPRAY | Freq: Four times a day (QID) | RESPIRATORY_TRACT | 0 refills | Status: AC | PRN
Start: 2022-07-13 — End: ?

## 2022-07-13 MED ORDER — SACCHAROMYCES BOULARDII 250 MG PO CAPS
250.0000 mg | ORAL_CAPSULE | Freq: Two times a day (BID) | ORAL | 0 refills | Status: DC
Start: 2022-07-13 — End: 2022-08-05

## 2022-07-13 NOTE — Progress Notes (Signed)
Virtual Visit Consent   Marcus Santiago, you are scheduled for a virtual visit with a [facility] provider today. Just as with appointments in the office, your consent must be obtained to participate. Your consent will be active for this visit and any virtual visit you may have with one of our providers in the next 365 days. If you have a MyChart account, a copy of this consent can be sent to you electronically.  As this is a virtual visit, video technology does not allow for your provider to perform a traditional examination. This may limit your provider's ability to fully assess your condition. If your provider identifies any concerns that need to be evaluated in person or the need to arrange testing (such as labs, EKG, etc.), we will make arrangements to do so. Although advances in technology are sophisticated, we cannot ensure that it will always work on either your end or our end. If the connection with a video visit is poor, the visit may have to be switched to a telephone visit. With either a video or telephone visit, we are not always able to ensure that we have a secure connection.  By engaging in this virtual visit, you consent to the provision of healthcare and authorize for your insurance to be billed (if applicable) for the services provided during this visit. Depending on your insurance coverage, you may receive a charge related to this service.  I need to obtain your verbal consent now. Are you willing to proceed with your visit today? Marcus Santiago has provided verbal consent on 07/13/2022 for a virtual visit (video or telephone). Freddy Finner, NP  Date: 07/13/2022 11:18 AM  Virtual Visit via Video Note   I, Freddy Finner, connected with  Marcus Santiago  ([id number], 1965-01-31) on 07/13/22 at 11:15 AM EDT by a video-enabled telemedicine application and verified that I am speaking with the correct person using two identifiers.  Location: Patient: Virtual Visit Location  Patient: Home Provider: Virtual Visit Location Provider: Home Office   I discussed the limitations of evaluation and management by telemedicine and the availability of in person appointments. The patient expressed understanding and agreed to proceed.    History of Present Illness: Marcus Santiago is a 57 y.o. who identifies as a male who was assigned male at birth, and is being seen today for covid +  Onset was sore throat around June 11-12th it went away for a few days and then around June 17-18th started with URI symptoms. Cold symptoms have lingered. Recent travel for around 16 days. Associated symptoms are congestion, thick green, yellow mucus, cough -chest tightness, shortness of breath with coughing, feverish and chills at times, had diarrhea as well Modifying factors are OTC cold medication Denies chest pain, gi symptoms have improved   Exposure to sick contacts- unknown COVID test: + Home test Vaccines: vaccines- and boosters   Problems:  Patient Active Problem List   Diagnosis Date Noted   Benign paroxysmal positional vertigo 06/18/2022   COVID-19 virus infection 07/17/2020   History of sensory changes 11/07/2018   Primary headache associated with sexual activity 11/07/2018   Primary thunderclap headache 11/07/2018   PCP NOTES >>> 11/07/2014   Annual physical exam 07/04/2012   HYPERLIPIDEMIA 10/25/2007   Migraine without aura 10/15/2006   Essential hypertension 08/30/2006    Allergies: No Known Allergies Medications:  Current Outpatient Medications:    aspirin 81 MG tablet, Take 81 mg by mouth daily., Disp: , Rfl:  fluticasone (FLONASE) 50 MCG/ACT nasal spray, Place 2 sprays into both nostrils daily., Disp: 16 g, Rfl: 0   lisinopril (ZESTRIL) 20 MG tablet, Take 1 tablet (20 mg total) by mouth daily., Disp: 90 tablet, Rfl: 0  Observations/Objective: Patient is well-developed, well-nourished in no acute distress.  Resting comfortably  at home.  Head is  normocephalic, atraumatic.  No labored breathing.  Speech is clear and coherent with logical content.  Patient is alert and oriented at baseline.    Assessment and Plan:  1. COVID-19  - promethazine-dextromethorphan (PROMETHAZINE-DM) 6.25-15 MG/5ML syrup; Take 5 mLs by mouth 4 (four) times daily as needed for cough.  Dispense: 118 mL; Refill: 0 - amoxicillin-clavulanate (AUGMENTIN) 875-125 MG tablet; Take 1 tablet by mouth 2 (two) times daily for 7 days.  Dispense: 14 tablet; Refill: 0 - benzonatate (TESSALON) 100 MG capsule; Take 1 capsule (100 mg total) by mouth 2 (two) times daily as needed for cough.  Dispense: 20 capsule; Refill: 0 - albuterol (VENTOLIN HFA) 108 (90 Base) MCG/ACT inhaler; Inhale 1-2 puffs into the lungs every 6 (six) hours as needed for wheezing or shortness of breath.  Dispense: 8 g; Refill: 0  2. Acute lower respiratory infection  - amoxicillin-clavulanate (AUGMENTIN) 875-125 MG tablet; Take 1 tablet by mouth 2 (two) times daily for 7 days.  Dispense: 14 tablet; Refill: 0 - albuterol (VENTOLIN HFA) 108 (90 Base) MCG/ACT inhaler; Inhale 1-2 puffs into the lungs every 6 (six) hours as needed for wheezing or shortness of breath.  Dispense: 8 g; Refill: 0  3. Diarrhea, unspecified type - saccharomyces boulardii (FLORASTOR) 250 MG capsule; Take 1 capsule (250 mg total) by mouth 2 (two) times daily.  Dispense: 30 capsule; Refill: 0   - Take meds as prescribed - Use saline nose sprays frequently to help soothe nasal passages if they are drying out. - Stay hydrated by drinking plenty of fluids - For any cough or congestion- robitussin DM or Delsym as needed - For fever or aches or pains- take tylenol or ibuprofen as directed on bottle             * for fevers greater than 101 orally you may alternate ibuprofen and tylenol every 3 hours.  If you do not improve you will need a follow up visit in person.               Reviewed side effects, risks and benefits of  medication.    Patient acknowledged agreement and understanding of the plan.   Past Medical, Surgical, Social History, Allergies, and Medications have been Reviewed.    Follow Up Instructions: I discussed the assessment and treatment plan with the patient. The patient was provided an opportunity to ask questions and all were answered. The patient agreed with the plan and demonstrated an understanding of the instructions.  A copy of instructions were sent to the patient via MyChart unless otherwise noted below.    The patient was advised to call back or seek an in-person evaluation if the symptoms worsen or if the condition fails to improve as anticipated.  Time:  I spent 10 minutes with the patient via telehealth technology discussing the above problems/concerns.    Freddy Finner, NP

## 2022-07-13 NOTE — Patient Instructions (Addendum)
Marcus Santiago, thank you for joining Freddy Finner, NP for today's virtual visit.  While this provider is not your primary care provider (PCP), if your PCP is located in our provider database this encounter information will be shared with them immediately following your visit.   A [facility] MyChart account gives you access to today's visit and all your visits, tests, and labs performed at [facility] " click here if you don't have a [facility] MyChart account or go to [email or web address]  Consent: (Patient) Marcus Santiago provided verbal consent for this virtual visit at the beginning of the encounter.  Current Medications:  Current Outpatient Medications:    albuterol (VENTOLIN HFA) 108 (90 Base) MCG/ACT inhaler, Inhale 1-2 puffs into the lungs every 6 (six) hours as needed for wheezing or shortness of breath., Disp: 8 g, Rfl: 0   amoxicillin-clavulanate (AUGMENTIN) 875-125 MG tablet, Take 1 tablet by mouth 2 (two) times daily for 7 days., Disp: 14 tablet, Rfl: 0   benzonatate (TESSALON) 100 MG capsule, Take 1 capsule (100 mg total) by mouth 2 (two) times daily as needed for cough., Disp: 20 capsule, Rfl: 0   promethazine-dextromethorphan (PROMETHAZINE-DM) 6.25-15 MG/5ML syrup, Take 5 mLs by mouth 4 (four) times daily as needed for cough., Disp: 118 mL, Rfl: 0   saccharomyces boulardii (FLORASTOR) 250 MG capsule, Take 1 capsule (250 mg total) by mouth 2 (two) times daily., Disp: 30 capsule, Rfl: 0   aspirin 81 MG tablet, Take 81 mg by mouth daily., Disp: , Rfl:    fluticasone (FLONASE) 50 MCG/ACT nasal spray, Place 2 sprays into both nostrils daily., Disp: 16 g, Rfl: 0   lisinopril (ZESTRIL) 20 MG tablet, Take 1 tablet (20 mg total) by mouth daily., Disp: 90 tablet, Rfl: 0   Medications ordered in this encounter:  Meds ordered this encounter  Medications   promethazine-dextromethorphan (PROMETHAZINE-DM) 6.25-15 MG/5ML syrup    Sig: Take 5 mLs by mouth 4 (four)  times daily as needed for cough.    Dispense:  118 mL    Refill:  0    Order Specific Question:   Supervising Provider    Answer:   Merrilee Jansky [id number]   amoxicillin-clavulanate (AUGMENTIN) 875-125 MG tablet    Sig: Take 1 tablet by mouth 2 (two) times daily for 7 days.    Dispense:  14 tablet    Refill:  0    Order Specific Question:   Supervising Provider    Answer:   Merrilee Jansky [[id number]]   benzonatate (TESSALON) 100 MG capsule    Sig: Take 1 capsule (100 mg total) by mouth 2 (two) times daily as needed for cough.    Dispense:  20 capsule    Refill:  0    Order Specific Question:   Supervising Provider    Answer:   Loreli Dollar   albuterol (VENTOLIN HFA) 108 (90 Base) MCG/ACT inhaler    Sig: Inhale 1-2 puffs into the lungs every 6 (six) hours as needed for wheezing or shortness of breath.    Dispense:  8 g    Refill:  0    Order Specific Question:   Supervising Provider    Answer:   Merrilee Jansky [[id number]]   saccharomyces boulardii (FLORASTOR) 250 MG capsule    Sig: Take 1 capsule (250 mg total) by mouth 2 (two) times daily.    Dispense:  30 capsule    Refill:  0    Order  Specific Question:   Supervising Provider    Answer:   Merrilee Jansky [[id number]]     *If you need refills on other medications prior to your next appointment, please contact your pharmacy*  Follow-Up: Call back or seek an in-person evaluation if the symptoms worsen or if the condition fails to improve as anticipated.  [facility] [phone]  Care Instructions: - Take meds as prescribed - Use saline nose sprays frequently to help soothe nasal passages if they are drying out. - Stay hydrated by drinking plenty of fluids - For any cough or congestion- robitussin DM or Delsym as needed - For fever or aches or pains- take tylenol or ibuprofen as directed on bottle             * for fevers greater than 101 orally you may alternate ibuprofen and tylenol  every 3 hours.  If you do not impro   Isolation Instructions: You are to isolate at home until you have been fever free for at least 24 hours without a fever-reducing medication, and symptoms have been steadily improving for 24 hours. At that time,  you can end isolation but need to mask for an additional 5 days.   If you must be around other household members who do not have symptoms, you need to make sure that both you and the family members are masking consistently with a high-quality mask.  If you note any worsening of symptoms despite treatment, please seek an in-person evaluation ASAP. If you note any significant shortness of breath or any chest pain, please seek ER evaluation. Please do not delay care!   COVID-19: What to Do if You Are Sick If you test positive and are an older adult or someone who is at high risk of getting very sick from COVID-19, treatment may be available. Contact a healthcare provider right away after a positive test to determine if you are eligible, even if your symptoms are mild right now. You can also visit a Test to Treat location and, if eligible, receive a prescription from a provider. Don't delay: Treatment must be started within the first few days to be effective. If you have a fever, cough, or other symptoms, you might have COVID-19. Most people have mild illness and are able to recover at home. If you are sick: Keep track of your symptoms. If you have an emergency warning sign (including trouble breathing), call 911. Steps to help prevent the spread of COVID-19 if you are sick If you are sick with COVID-19 or think you might have COVID-19, follow the steps below to care for yourself and to help protect other people in your home and community. Stay home except to get medical care Stay home. Most people with COVID-19 have mild illness and can recover at home without medical care. Do not leave your home, except to get medical care. Do not visit public areas  and do not go to places where you are unable to wear a mask. Take care of yourself. Get rest and stay hydrated. Take over-the-counter medicines, such as acetaminophen, to help you feel better. Stay in touch with your doctor. Call before you get medical care. Be sure to get care if you have trouble breathing, or have any other emergency warning signs, or if you think it is an emergency. Avoid public transportation, ride-sharing, or taxis if possible. Get tested If you have symptoms of COVID-19, get tested. While waiting for test results, stay away from others,  including staying apart from those living in your household. Get tested as soon as possible after your symptoms start. Treatments may be available for people with COVID-19 who are at risk for becoming very sick. Don't delay: Treatment must be started early to be effective--some treatments must begin within 5 days of your first symptoms. Contact your healthcare provider right away if your test result is positive to determine if you are eligible. Self-tests are one of several options for testing for the virus that causes COVID-19 and may be more convenient than laboratory-based tests and point-of-care tests. Ask your healthcare provider or your local health department if you need help interpreting your test results. You can visit your state, tribal, local, and territorial health department's website to look for the latest local information on testing sites. Separate yourself from other people As much as possible, stay in a specific room and away from other people and pets in your home. If possible, you should use a separate bathroom. If you need to be around other people or animals in or outside of the home, wear a well-fitting mask. Tell your close contacts that they may have been exposed to COVID-19. An infected person can spread COVID-19 starting 48 hours (or 2 days) before the person has any symptoms or tests positive. By letting your close  contacts know they may have been exposed to COVID-19, you are helping to protect everyone. See COVID-19 and Animals if you have questions about pets. If you are diagnosed with COVID-19, someone from the health department may call you. Answer the call to slow the spread. Monitor your symptoms Symptoms of COVID-19 include fever, cough, or other symptoms. Follow care instructions from your healthcare provider and local health department. Your local health authorities may give instructions on checking your symptoms and reporting information. When to seek emergency medical attention Look for emergency warning signs* for COVID-19. If someone is showing any of these signs, seek emergency medical care immediately: Trouble breathing Persistent pain or pressure in the chest New confusion Inability to wake or stay awake Pale, gray, or blue-colored skin, lips, or nail beds, depending on skin tone *This list is not all possible symptoms. Please call your medical provider for any other symptoms that are severe or concerning to you. Call 911 or call ahead to your local emergency facility: Notify the operator that you are seeking care for someone who has or may have COVID-19. Call ahead before visiting your doctor Call ahead. Many medical visits for routine care are being postponed or done by phone or telemedicine. If you have a medical appointment that cannot be postponed, call your doctor's office, and tell them you have or may have COVID-19. This will help the office protect themselves and other patients. If you are sick, wear a well-fitting mask You should wear a mask if you must be around other people or animals, including pets (even at home). Wear a mask with the best fit, protection, and comfort for you. You don't need to wear the mask if you are alone. If you can't put on a mask (because of trouble breathing, for example), cover your coughs and sneezes in some other way. Try to stay at least 6 feet away  from other people. This will help protect the people around you. Masks should not be placed on young children under age 85 years, anyone who has trouble breathing, or anyone who is not able to remove the mask without help. Cover your coughs and sneezes Cover your mouth  and nose with a tissue when you cough or sneeze. Throw away used tissues in a lined trash can. Immediately wash your hands with soap and water for at least 20 seconds. If soap and water are not available, clean your hands with an alcohol-based hand sanitizer that contains at least 60% alcohol. Clean your hands often Wash your hands often with soap and water for at least 20 seconds. This is especially important after blowing your nose, coughing, or sneezing; going to the bathroom; and before eating or preparing food. Use hand sanitizer if soap and water are not available. Use an alcohol-based hand sanitizer with at least 60% alcohol, covering all surfaces of your hands and rubbing them together until they feel dry. Soap and water are the best option, especially if hands are visibly dirty. Avoid touching your eyes, nose, and mouth with unwashed hands. Handwashing Tips Avoid sharing personal household items Do not share dishes, drinking glasses, cups, eating utensils, towels, or bedding with other people in your home. Wash these items thoroughly after using them with soap and water or put in the dishwasher. Clean surfaces in your home regularly Clean and disinfect high-touch surfaces (for example, doorknobs, tables, handles, light switches, and countertops) in your "sick room" and bathroom. In shared spaces, you should clean and disinfect surfaces and items after each use by the person who is ill. If you are sick and cannot clean, a caregiver or other person should only clean and disinfect the area around you (such as your bedroom and bathroom) on an as needed basis. Your caregiver/other person should wait as long as possible (at least  several hours) and wear a mask before entering, cleaning, and disinfecting shared spaces that you use. Clean and disinfect areas that may have blood, stool, or body fluids on them. Use household cleaners and disinfectants. Clean visible dirty surfaces with household cleaners containing soap or detergent. Then, use a household disinfectant. Use a product from [employer] List N: Disinfectants for Coronavirus (COVID-19). Be sure to follow the instructions on the label to ensure safe and effective use of the product. Many products recommend keeping the surface wet with a disinfectant for a certain period of time (look at "contact time" on the product label). You may also need to wear personal protective equipment, such as gloves, depending on the directions on the product label. Immediately after disinfecting, wash your hands with soap and water for 20 seconds. For completed guidance on cleaning and disinfecting your home, visit Complete Disinfection Guidance. Take steps to improve ventilation at home Improve ventilation (air flow) at home to help prevent from spreading COVID-19 to other people in your household. Clear out COVID-19 virus particles in the air by opening windows, using air filters, and turning on fans in your home. Use this interactive tool to learn how to improve air flow in your home. When you can be around others after being sick with COVID-19 Deciding when you can be around others is different for different situations. Find out when you can safely end home isolation. For any additional questions about your care, contact your healthcare provider or state or local health department. 04/09/2020 Content source: [facility] ([facility]), Division of Viral Diseases This information is not intended to replace advice given to you by your health care provider. Make sure you discuss any questions you have with your health care provider. Document Revised:  05/23/2020 Document Reviewed: 05/23/2020 Elsevier Patient Education  2022 [employer].  If you have been instructed to have an in-person evaluation today at a local Urgent Care facility, please use the link below. It will take you to a list of all of our available [facility], including address, phone number and hours of operation. Please do not delay care.  [facility]  If you or a family member do not have a primary care provider, use the link below to schedule a visit and establish care. When you choose a [facility] primary care physician or advanced practice provider, you gain a long-term partner in health. Find a Primary Care Provider  Learn more about [facility]'s in-office and virtual care options: [facility] - Get Care Now

## 2022-07-14 ENCOUNTER — Other Ambulatory Visit (INDEPENDENT_AMBULATORY_CARE_PROVIDER_SITE_OTHER): Payer: BC Managed Care – PPO

## 2022-07-14 DIAGNOSIS — I10 Essential (primary) hypertension: Secondary | ICD-10-CM | POA: Diagnosis not present

## 2022-07-14 LAB — BASIC METABOLIC PANEL
BUN: 15 mg/dL (ref 6–23)
CO2: 29 mEq/L (ref 19–32)
Calcium: 8.7 mg/dL (ref 8.4–10.5)
Chloride: 103 mEq/L (ref 96–112)
Creatinine, Ser: 1.02 mg/dL (ref 0.40–1.50)
GFR: 83.3 mL/min (ref >60.00)
Glucose, Bld: 88 mg/dL (ref 70–99)
Potassium: 3.8 mEq/L (ref 3.5–5.1)
Sodium: 140 mEq/L (ref 135–145)

## 2022-08-04 ENCOUNTER — Other Ambulatory Visit: Payer: Self-pay

## 2022-08-04 ENCOUNTER — Emergency Department (HOSPITAL_BASED_OUTPATIENT_CLINIC_OR_DEPARTMENT_OTHER): Payer: BC Managed Care – PPO

## 2022-08-04 ENCOUNTER — Ambulatory Visit (INDEPENDENT_AMBULATORY_CARE_PROVIDER_SITE_OTHER): Payer: BC Managed Care – PPO | Admitting: Internal Medicine

## 2022-08-04 ENCOUNTER — Encounter: Payer: Self-pay | Admitting: Internal Medicine

## 2022-08-04 ENCOUNTER — Emergency Department (HOSPITAL_BASED_OUTPATIENT_CLINIC_OR_DEPARTMENT_OTHER)
Admission: EM | Admit: 2022-08-04 | Discharge: 2022-08-04 | Disposition: A | Discharge status: Home / Self Care | Payer: BC Managed Care – PPO | Attending: Emergency Medicine | Admitting: Emergency Medicine

## 2022-08-04 ENCOUNTER — Encounter (HOSPITAL_BASED_OUTPATIENT_CLINIC_OR_DEPARTMENT_OTHER): Payer: Self-pay

## 2022-08-04 VITALS — BP 128/72 | HR 54 | Temp 98.7°F | Resp 16 | Ht 70.0 in | Wt 199.1 lb

## 2022-08-04 DIAGNOSIS — I10 Essential (primary) hypertension: Secondary | ICD-10-CM | POA: Diagnosis not present

## 2022-08-04 DIAGNOSIS — Z7982 Long term (current) use of aspirin: Secondary | ICD-10-CM | POA: Insufficient documentation

## 2022-08-04 DIAGNOSIS — R079 Chest pain, unspecified: Secondary | ICD-10-CM

## 2022-08-04 DIAGNOSIS — R0789 Other chest pain: Secondary | ICD-10-CM | POA: Diagnosis not present

## 2022-08-04 DIAGNOSIS — R109 Unspecified abdominal pain: Secondary | ICD-10-CM | POA: Diagnosis not present

## 2022-08-04 LAB — CBC
HCT: 43.2 % (ref 39.0–52.0)
Hemoglobin: 14.3 g/dL (ref 13.0–17.0)
MCH: 28.4 pg (ref 26.0–34.0)
MCHC: 33.1 g/dL (ref 30.0–36.0)
MCV: 85.9 fL (ref 80.0–100.0)
Platelets: 266 10*3/uL (ref 150–400)
RBC: 5.03 MIL/uL (ref 4.22–5.81)
RDW: 13.3 % (ref 11.5–15.5)
WBC: 5.5 10*3/uL (ref 4.0–10.5)
nRBC: 0 % (ref 0.0–0.2)

## 2022-08-04 LAB — BASIC METABOLIC PANEL
Anion gap: 9 (ref 5–15)
BUN: 20 mg/dL (ref 6–20)
CO2: 26 mmol/L (ref 22–32)
Calcium: 8.8 mg/dL — ABNORMAL LOW (ref 8.9–10.3)
Chloride: 102 mmol/L (ref 98–111)
Creatinine, Ser: 1.11 mg/dL (ref 0.61–1.24)
GFR, Estimated: >60 mL/min (ref >60)
Glucose, Bld: 98 mg/dL (ref 70–99)
Potassium: 3.8 mmol/L (ref 3.5–5.1)
Sodium: 137 mmol/L (ref 135–145)

## 2022-08-04 LAB — TROPONIN I (HIGH SENSITIVITY): Troponin I (High Sensitivity): 5 ng/L (ref <18)

## 2022-08-04 MED ORDER — IOHEXOL 350 MG/ML SOLN
75.0000 mL | Freq: Once | INTRAVENOUS | Status: AC | PRN
  Administered 2022-08-04: 75 mL via INTRAVENOUS

## 2022-08-04 NOTE — Discharge Instructions (Signed)
Your workup was reassuring.  No concerning cause of your chest pain identified today.  Particularly the CT scan of the chest did not show any blood clots.  Follow-up with your PCP.  If you have any concerning symptoms return to the emergency room.

## 2022-08-04 NOTE — ED Provider Notes (Signed)
EMERGENCY DEPARTMENT AT [facility] Provider Note   CSN: [id number] Arrival date & time: 08/04/22  1652     History  Chief Complaint  Patient presents with   Chest Pain    Marcus Santiago is a 57 y.o. male.  57 year old male presents today for evaluation of chest pain.  He has been referred from PCP office to rule out PE given.  Patient denies history of cardiac disease.  Does endorse CAD in his dad.  Denies any family history of clotting disorders with the exception of his mom both by medicine and not genetics.  States prior to onset of his symptoms which was a few weeks ago he returned from a flight from [place].  Soon after returning he did also get diagnosed with COVID.  Was not able to take any antivirals due to being out of the window.  States his symptoms are primarily exertional.  Position has no effect on his chest pain.  It is not reproducible with patient.  Not pleuritic in nature.  Occasionally gets short of breath during conversation.  The history is provided by the patient. No language interpreter was used.       Home Medications Prior to Admission medications   Medication Sig Start Date End Date Taking? Authorizing Provider  albuterol (VENTOLIN HFA) 108 (90 Base) MCG/ACT inhaler Inhale 1-2 puffs into the lungs every 6 (six) hours as needed for wheezing or shortness of breath. Patient not taking: Reported on 08/04/2022 07/13/22   Freddy Finner, NP  aspirin 81 MG tablet Take 81 mg by mouth daily.    [provider]  benzonatate (TESSALON) 100 MG capsule Take 1 capsule (100 mg total) by mouth 2 (two) times daily as needed for cough. Patient not taking: Reported on 08/04/2022 07/13/22   Freddy Finner, NP  fluticasone [facility]) 50 MCG/ACT nasal spray Place 2 sprays into both nostrils daily. 04/02/22   Waldon Merl, PA-C  lisinopril (ZESTRIL) 20 MG tablet Take 1 tablet (20 mg total) by mouth daily. 06/16/22   Sandford Craze, NP   promethazine-dextromethorphan (PROMETHAZINE-DM) 6.25-15 MG/5ML syrup Take 5 mLs by mouth 4 (four) times daily as needed for cough. Patient not taking: Reported on 08/04/2022 07/13/22   Freddy Finner, NP  saccharomyces boulardii (FLORASTOR) 250 MG capsule Take 1 capsule (250 mg total) by mouth 2 (two) times daily. Patient not taking: Reported on 08/04/2022 07/13/22   Freddy Finner, NP      Allergies    Patient has no known allergies.    Review of Systems   Review of Systems  Constitutional:  Negative for chills and fever.  Respiratory:  Positive for shortness of breath. Negative for cough.   Cardiovascular:  Positive for chest pain. Negative for palpitations and leg swelling.  Gastrointestinal:  Negative for abdominal pain.  Neurological:  Negative for syncope and light-headedness.  All other systems reviewed and are negative.   Physical Exam Updated Vital Signs BP (!) 180/121   Pulse 64   Temp 97.8 F (36.6 C) (Oral)   Resp 18   Ht 5\' 10"  (1.778 m)   Wt 90.3 kg   SpO2 100%   BMI 28.55 kg/m  Physical Exam Vitals and nursing note reviewed.  Constitutional:      General: He is not in acute distress.    Appearance: Normal appearance. He is not ill-appearing.  HENT:     Head: Normocephalic and atraumatic.     Nose: Nose normal.  Eyes:     General: No scleral icterus.    Extraocular Movements: Extraocular movements intact.     Conjunctiva/sclera: Conjunctivae normal.  Cardiovascular:     Rate and Rhythm: Normal rate and regular rhythm.     Heart sounds: Normal heart sounds.  Pulmonary:     Effort: Pulmonary effort is normal. No respiratory distress.     Breath sounds: Normal breath sounds. No wheezing or rales.  Musculoskeletal:        General: Normal range of motion.     Cervical back: Normal range of motion.  Skin:    General: Skin is warm and dry.  Neurological:     General: No focal deficit present.     Mental Status: He is alert. Mental status is at baseline.      ED Results / Procedures / Treatments   Labs (all labs ordered are listed, but only abnormal results are displayed) Labs Reviewed  BASIC METABOLIC PANEL - Abnormal; Notable for the following components:      Result Value   Calcium 8.8 (*)    All other components within normal limits  CBC  TROPONIN I (HIGH SENSITIVITY)  TROPONIN I (HIGH SENSITIVITY)    EKG None  Radiology DG Chest 2 View  Result Date: 08/04/2022 CLINICAL DATA:  Chest pain EXAM: CHEST - 2 VIEW COMPARISON:  07/04/2008 FINDINGS: The heart size and mediastinal contours are within normal limits. Both lungs are clear. The visualized skeletal structures are unremarkable. IMPRESSION: No active cardiopulmonary disease. Electronically Signed   By: Burman Nieves M.D.   On: 08/04/2022 17:58    Procedures Procedures    Medications Ordered in ED Medications - No data to display  ED Course/ Medical Decision Making/ A&P                             Medical Decision Making Amount and/or Complexity of Data Reviewed Labs: ordered. Radiology: ordered.  Risk Prescription drug management.   Medical Decision Making / ED Course   This patient presents to the ED for concern of chest discomfort, this involves an extensive number of treatment options, and is a complaint that carries with it a high risk of complications and morbidity.  The differential diagnosis includes ACS, PE, pneumonia, MSK etiology, pericarditis  MDM: 57 year old male presents from PCP office to rule out PE or other emergent cause of chest pain.  Symptoms have been ongoing for a few weeks.  Describes it as a burning chest discomfort that radiates from epigastric region up to his chest. did discuss potential for GERD as the etiology however he states this is not similar to his previous episodes of gerd and does not feel that this is the etiology.  He does have some risk factors for travel, recent COVID infection.  Not hypoxic, not tachypneic, and not  tachycardic.  Will evaluate with PE study to rule out this as the etiology.  Blood work was obtained from triage is reassuring.  CBC is marked.  BMP is without acute concerns.  Initial troponin of 5.  Given duration of symptoms do not feel warranted.  EKG without acute ischemic changes.  Chest x-ray without acute cardiopulmonary process.   CT chest PE study shows no evidence of PE.  No concerning cause of patient's chest pain identified.  Follow-up with PCP discussed.  Patient is appropriate for discharge.  Discharged in stable condition.  Return precautions discussed.  Lab Tests: -I ordered, reviewed,  and interpreted labs.   The pertinent results include:   Labs Reviewed  BASIC METABOLIC PANEL - Abnormal; Notable for the following components:      Result Value   Calcium 8.8 (*)    All other components within normal limits  CBC  TROPONIN I (HIGH SENSITIVITY)  TROPONIN I (HIGH SENSITIVITY)      EKG  EKG Interpretation Date/Time:    Ventricular Rate:    PR Interval:    QRS Duration:    QT Interval:    QTC Calculation:   R Axis:      Text Interpretation:           Imaging Studies ordered: I ordered imaging studies including cxr, ct chest PE study I independently visualized and interpreted imaging. I agree with the radiologist interpretation   Medicines ordered and prescription drug management: No orders of the defined types were placed in this encounter.   -I have reviewed the patients home medicines and have made adjustments as needed   Reevaluation: After the interventions noted above, I reevaluated the patient and found that they have :stayed the same  Co morbidities that complicate the patient evaluation  Past Medical History:  Diagnosis Date   Allergy    Chest pain    (-) stress test 2008, 2010   Headache    Hyperlipemia    Hypertension       Dispostion: Patient discharged in stable condition.  Currently without chest pain.  Strict return precaution  given.  Patient appropriate for discharge to follow-up with PCP.    Final Clinical Impression(s) / ED Diagnoses Final diagnoses:  Atypical chest pain    Rx / DC Orders ED Discharge Orders     None         Marita Kansas, PA-C 08/04/22 2320    Linwood Dibbles, MD 08/05/22 [phone]

## 2022-08-04 NOTE — ED Triage Notes (Addendum)
Pt arrives with c/o chest pain with exertion that started in the last few weeks. Pt had COVID in June and then started having chest pain after. Pt describes the pain as a burning sensation. Pt denies SOB. Pt sent by PCP for PE workup.

## 2022-08-04 NOTE — Progress Notes (Unsigned)
Subjective:    Patient ID: Marcus Santiago, male    DOB: [birth year], 57 y.o.   MRN: [id number]  DOS:  08/04/2022 Type of visit - description: Acute, recent COVID, chest pain  Symptoms started around June 17, with the yellow mucus, cough-chest tightness, some shortness of breath with coughing and a burning sensation at the anterior chest.  Subjective fever. He tested positive for COVID. Was seen virtually, Rx Augmentin, albuterol, Tessalon Perles.  He initially felt somewhat better but in the last few days has noted a burning sensation at the chest , is only exertional, it radiates upward to the neck and jaw bilaterally. It can be intense but not associated with nausea, diaphoresis or palpitations. Pain goes away shortly after he stopped exercising. No symptoms when he is at rest or when he bends forward. No actual fever Still have mild cough and minimal sputum production. Has some wheezing. No edema or palpitations No GERD symptoms  Review of Systems See above   Past Medical History:  Diagnosis Date   Allergy    Chest pain    (-) stress test 2008, 2010   Headache    Hyperlipemia    Hypertension     Past Surgical History:  Procedure Laterality Date   KNEE ARTHROSCOPY     L x 2    Current Outpatient Medications  Medication Instructions   albuterol (VENTOLIN HFA) 108 (90 Base) MCG/ACT inhaler 1-2 puffs, Inhalation, Every 6 hours PRN   aspirin 81 mg, Oral, Daily   benzonatate (TESSALON) 100 mg, Oral, 2 times daily PRN   fluticasone (FLONASE) 50 MCG/ACT nasal spray 2 sprays, Each Nare, Daily   lisinopril (ZESTRIL) 20 mg, Oral, Daily   promethazine-dextromethorphan (PROMETHAZINE-DM) 6.25-15 MG/5ML syrup 5 mLs, Oral, 4 times daily PRN   saccharomyces boulardii (FLORASTOR) 250 mg, Oral, 2 times daily       Objective:   Physical Exam BP 128/72   Pulse (!) 54   Temp 98.7 F (37.1 C) (Oral)   Resp 16   Ht 5\' 10"  (1.778 m)   Wt 199 lb 2 oz (90.3 kg)   SpO2 97%    BMI 28.57 kg/m  General:   Well developed, NAD, BMI noted.  HEENT:  Normocephalic . Face symmetric, atraumatic Lungs:  CTA B Normal respiratory effort, no intercostal retractions, no accessory muscle use. Chest wall: No TTP Heart: RRR,  no murmur.  Abdomen:  Not distended, soft, non-tender. No rebound or rigidity.   Skin: Not pale. Not jaundice Lower extremities: no pretibial edema bilaterally . Neurologic:  alert & oriented X3.  Speech normal, gait appropriate for age and unassisted Psych--  Cognition and judgment appear intact.  Cooperative with normal attention span and concentration.  Behavior appropriate. No anxious or depressed appearing.     Assessment     Assessment HTN Hyperlipidemia Chest pain : (-) stress test 2008, 2010 Migraines FH CAD: F, GF  PLAN COVID-19, exertional chest pain: COVID symptoms started around June 17 while he was visiting [place], treated with antibiotics June 24 in the [place],, never got antivirals due to be out of the window opportunity for antivirals. He presents now with exertional chest pain. EKG today  Sinus rhythm, left atrial enlargement and other findings not new compared to previous EKG I am concerned about exertional chest pain as described above in the context of a recent trip to [place], PE?Marland Kitchen Also in the context of recent COVID infection, pericarditis?  He is at increased risk of ACS.  Plan: Will refer patient to the ER for immediate evaluation to rule out above conditions.  I spoke with the ER physician who accepted patient.  Appreciate their help.

## 2022-08-04 NOTE — Patient Instructions (Signed)
Please go to the emergency room.

## 2022-08-05 ENCOUNTER — Encounter: Payer: Self-pay | Admitting: Internal Medicine

## 2022-08-05 LAB — SEDIMENTATION RATE: Sed Rate: 8 mm/hr (ref 0–20)

## 2022-08-05 NOTE — Assessment & Plan Note (Addendum)
COVID-19, exertional chest pain: COVID symptoms started around June 17 while he was visiting [place], treated with antibiotics June 24 in the [place],, never got antivirals due to be out of the window opportunity for antivirals. He presents now with exertional chest pain. EKG today  Sinus rhythm, left atrial enlargement and other findings not new compared to previous EKG I am concerned about exertional chest pain as described above in the context of a recent trip to [place], PE?Marland Kitchen Also in the context of recent COVID infection, pericarditis?  He is also at increased risk of ACS. Plan: Will refer patient to the ER for immediate evaluation to rule out above conditions.  I spoke with the ER physician who accepted patient.  Appreciate their help.  Addendum: At the ER calcium was 8.8, creatinine normal, CBC normal, troponin negative, chest x-ray negative, CT angio with no PE or pulmonary disease.  No pericardial effusion. Plan: Check echocardiogram to rule out small pericardial effusion. Call if symptoms increase or severe.  I sent the patient a message.

## 2022-08-06 NOTE — Addendum Note (Signed)
Addended byConrad Woodburn D on: 08/06/2022 07:50 AM   Modules accepted: Orders

## 2022-08-10 ENCOUNTER — Encounter: Payer: Self-pay | Admitting: Internal Medicine

## 2022-08-10 ENCOUNTER — Ambulatory Visit: Payer: BC Managed Care – PPO | Admitting: Internal Medicine

## 2022-08-10 NOTE — Telephone Encounter (Signed)
Cancel today's appointment.

## 2022-08-14 ENCOUNTER — Ambulatory Visit (HOSPITAL_COMMUNITY): Payer: BC Managed Care – PPO | Attending: Internal Medicine

## 2022-08-14 DIAGNOSIS — R079 Chest pain, unspecified: Secondary | ICD-10-CM | POA: Diagnosis not present

## 2022-08-14 LAB — ECHOCARDIOGRAM COMPLETE
Area-P 1/2: 3.39 cm2
S' Lateral: 3.5 cm

## 2022-08-19 ENCOUNTER — Other Ambulatory Visit: Payer: Self-pay | Admitting: Internal Medicine

## 2022-08-19 ENCOUNTER — Telehealth: Payer: Self-pay

## 2022-08-19 MED ORDER — PANTOPRAZOLE SODIUM 40 MG PO TBEC: 40.0000 mg | DELAYED_RELEASE_TABLET | Freq: Every day | ORAL | 1 refills | Status: DC

## 2022-08-19 NOTE — Telephone Encounter (Signed)
Send him a message, recommend omeprazole 20 mg OTC: 2 tablets in the morning.

## 2022-08-19 NOTE — Telephone Encounter (Signed)
Pantoprazole 40 mg 1 p.o. daily, #30, 1 refill

## 2022-08-19 NOTE — Telephone Encounter (Signed)
Rx sent 

## 2022-08-19 NOTE — Addendum Note (Signed)
Addended byConrad [place] D on: 08/19/2022 01:29 PM   Modules accepted: Orders

## 2022-08-19 NOTE — Telephone Encounter (Signed)
Pt's plan is going to require Pt to try and fail- 2 (two) over-the-counter proton pump inhibitor: Omeprazole capsule (Prilosec OTC), Esomeprazole capsule (Nexium OTC), Lansoprazole capsule (Prevacid OTC)  Please advise.

## 2022-08-19 NOTE — Telephone Encounter (Signed)
Mychart message sent.

## 2022-08-19 NOTE — Telephone Encounter (Signed)
PA initiated via Covermymeds; KEY: [id number]. Awaiting determination.

## 2022-08-25 ENCOUNTER — Other Ambulatory Visit: Payer: Self-pay

## 2022-08-25 DIAGNOSIS — K219 Gastro-esophageal reflux disease without esophagitis: Secondary | ICD-10-CM

## 2022-09-09 NOTE — Telephone Encounter (Signed)
Please call the GI scheduler. Could he be seen sooner?  Placed on a waiting list?. Let the patient know of above

## 2022-09-12 ENCOUNTER — Other Ambulatory Visit: Payer: Self-pay | Admitting: Family

## 2022-09-12 DIAGNOSIS — I10 Essential (primary) hypertension: Secondary | ICD-10-CM

## 2022-09-28 ENCOUNTER — Other Ambulatory Visit: Payer: Self-pay | Admitting: Internal Medicine

## 2022-10-01 DIAGNOSIS — I10 Essential (primary) hypertension: Secondary | ICD-10-CM | POA: Diagnosis not present

## 2022-10-01 DIAGNOSIS — K219 Gastro-esophageal reflux disease without esophagitis: Secondary | ICD-10-CM | POA: Diagnosis not present

## 2022-10-04 ENCOUNTER — Other Ambulatory Visit: Payer: Self-pay | Admitting: Internal Medicine

## 2022-10-04 DIAGNOSIS — I10 Essential (primary) hypertension: Secondary | ICD-10-CM

## 2022-10-06 ENCOUNTER — Telehealth: Payer: Self-pay

## 2022-10-06 NOTE — Telephone Encounter (Signed)
Pharmacy Patient Advocate Encounter   Received notification from CoverMyMeds that prior authorization for Pantoprazole Sodium 40MG  dr tablets is required/requested.   Insurance verification completed.   The patient is insured through [facility] .   Per test claim: PA required; PA submitted to [employer] via CoverMyMeds Key/confirmation #/EOC  [id number] Status is pending

## 2022-10-07 NOTE — Telephone Encounter (Signed)
Pharmacy Patient Advocate Encounter  Received notification from [facility] that Prior Authorization for Pantoprazole Sodium 40MG  dr tablets  has been APPROVED from 10/06/22 to 10/06/23   PA #/Case ID/Reference #: [id number]

## 2022-10-07 NOTE — Telephone Encounter (Signed)
Noted  

## 2022-12-08 DIAGNOSIS — R1013 Epigastric pain: Secondary | ICD-10-CM | POA: Diagnosis not present

## 2022-12-08 DIAGNOSIS — I10 Essential (primary) hypertension: Secondary | ICD-10-CM | POA: Diagnosis not present

## 2022-12-08 DIAGNOSIS — K219 Gastro-esophageal reflux disease without esophagitis: Secondary | ICD-10-CM | POA: Diagnosis not present

## 2022-12-08 DIAGNOSIS — M67814 Other specified disorders of tendon, left shoulder: Secondary | ICD-10-CM | POA: Diagnosis not present

## 2022-12-10 ENCOUNTER — Encounter: Payer: Self-pay | Admitting: Internal Medicine

## 2022-12-28 DIAGNOSIS — R1013 Epigastric pain: Secondary | ICD-10-CM | POA: Diagnosis not present

## 2022-12-28 DIAGNOSIS — Z1211 Encounter for screening for malignant neoplasm of colon: Secondary | ICD-10-CM | POA: Diagnosis not present

## 2022-12-28 DIAGNOSIS — A048 Other specified bacterial intestinal infections: Secondary | ICD-10-CM | POA: Diagnosis not present

## 2022-12-29 DIAGNOSIS — M25512 Pain in left shoulder: Secondary | ICD-10-CM | POA: Diagnosis not present

## 2023-01-04 DIAGNOSIS — R1013 Epigastric pain: Secondary | ICD-10-CM | POA: Diagnosis not present

## 2023-01-11 DIAGNOSIS — Z1211 Encounter for screening for malignant neoplasm of colon: Secondary | ICD-10-CM | POA: Diagnosis not present

## 2023-01-11 DIAGNOSIS — K219 Gastro-esophageal reflux disease without esophagitis: Secondary | ICD-10-CM | POA: Diagnosis not present

## 2023-01-24 DIAGNOSIS — K200 Eosinophilic esophagitis: Secondary | ICD-10-CM | POA: Diagnosis not present

## 2023-01-27 DIAGNOSIS — M25512 Pain in left shoulder: Secondary | ICD-10-CM | POA: Diagnosis not present

## 2023-02-22 DIAGNOSIS — R1012 Left upper quadrant pain: Secondary | ICD-10-CM | POA: Diagnosis not present

## 2023-02-22 DIAGNOSIS — K824 Cholesterolosis of gallbladder: Secondary | ICD-10-CM | POA: Diagnosis not present

## 2023-02-22 DIAGNOSIS — K2270 Barrett's esophagus without dysplasia: Secondary | ICD-10-CM | POA: Diagnosis not present

## 2023-02-22 DIAGNOSIS — K449 Diaphragmatic hernia without obstruction or gangrene: Secondary | ICD-10-CM | POA: Diagnosis not present

## 2023-03-09 DIAGNOSIS — E785 Hyperlipidemia, unspecified: Secondary | ICD-10-CM | POA: Diagnosis not present

## 2023-03-09 DIAGNOSIS — K219 Gastro-esophageal reflux disease without esophagitis: Secondary | ICD-10-CM | POA: Insufficient documentation

## 2023-03-09 DIAGNOSIS — K449 Diaphragmatic hernia without obstruction or gangrene: Secondary | ICD-10-CM | POA: Insufficient documentation

## 2023-03-09 DIAGNOSIS — I10 Essential (primary) hypertension: Secondary | ICD-10-CM | POA: Diagnosis not present

## 2023-03-17 DIAGNOSIS — R140 Abdominal distension (gaseous): Secondary | ICD-10-CM | POA: Diagnosis not present

## 2023-03-17 DIAGNOSIS — K449 Diaphragmatic hernia without obstruction or gangrene: Secondary | ICD-10-CM | POA: Diagnosis not present

## 2023-03-25 DIAGNOSIS — K219 Gastro-esophageal reflux disease without esophagitis: Secondary | ICD-10-CM | POA: Diagnosis not present

## 2023-03-25 DIAGNOSIS — I10 Essential (primary) hypertension: Secondary | ICD-10-CM | POA: Diagnosis not present

## 2023-03-25 DIAGNOSIS — K449 Diaphragmatic hernia without obstruction or gangrene: Secondary | ICD-10-CM | POA: Diagnosis not present

## 2023-03-25 DIAGNOSIS — E785 Hyperlipidemia, unspecified: Secondary | ICD-10-CM | POA: Diagnosis not present

## 2023-03-29 DIAGNOSIS — R519 Headache, unspecified: Secondary | ICD-10-CM | POA: Diagnosis not present

## 2023-03-29 DIAGNOSIS — I10 Essential (primary) hypertension: Secondary | ICD-10-CM | POA: Diagnosis not present

## 2023-03-29 DIAGNOSIS — K219 Gastro-esophageal reflux disease without esophagitis: Secondary | ICD-10-CM | POA: Diagnosis not present

## 2023-03-30 DIAGNOSIS — M25512 Pain in left shoulder: Secondary | ICD-10-CM | POA: Diagnosis not present

## 2023-05-06 ENCOUNTER — Encounter: Payer: Self-pay | Admitting: Internal Medicine

## 2023-07-07 DIAGNOSIS — R7303 Prediabetes: Secondary | ICD-10-CM | POA: Diagnosis not present

## 2023-07-07 DIAGNOSIS — I10 Essential (primary) hypertension: Secondary | ICD-10-CM | POA: Diagnosis not present

## 2023-07-07 DIAGNOSIS — E785 Hyperlipidemia, unspecified: Secondary | ICD-10-CM | POA: Diagnosis not present

## 2023-07-07 DIAGNOSIS — E559 Vitamin D deficiency, unspecified: Secondary | ICD-10-CM | POA: Diagnosis not present

## 2023-07-07 DIAGNOSIS — Z125 Encounter for screening for malignant neoplasm of prostate: Secondary | ICD-10-CM | POA: Diagnosis not present

## 2023-07-14 ENCOUNTER — Other Ambulatory Visit (HOSPITAL_BASED_OUTPATIENT_CLINIC_OR_DEPARTMENT_OTHER): Payer: Self-pay | Admitting: Internal Medicine

## 2023-07-14 DIAGNOSIS — K219 Gastro-esophageal reflux disease without esophagitis: Secondary | ICD-10-CM | POA: Diagnosis not present

## 2023-07-14 DIAGNOSIS — I10 Essential (primary) hypertension: Secondary | ICD-10-CM | POA: Diagnosis not present

## 2023-07-14 DIAGNOSIS — Z0001 Encounter for general adult medical examination with abnormal findings: Secondary | ICD-10-CM | POA: Diagnosis not present

## 2023-07-14 DIAGNOSIS — E785 Hyperlipidemia, unspecified: Secondary | ICD-10-CM

## 2023-07-21 ENCOUNTER — Ambulatory Visit (HOSPITAL_BASED_OUTPATIENT_CLINIC_OR_DEPARTMENT_OTHER)
Admission: RE | Admit: 2023-07-21 | Discharge: 2023-07-21 | Disposition: A | Discharge status: Home / Self Care | Payer: Self-pay | Source: Ambulatory Visit | Attending: Internal Medicine | Admitting: Internal Medicine

## 2023-07-21 DIAGNOSIS — E785 Hyperlipidemia, unspecified: Secondary | ICD-10-CM | POA: Insufficient documentation

## 2023-09-18 IMAGING — CT CT CARDIAC CORONARY ARTERY CALCIUM SCORE
2 series · 14 of 20 positions shown, 16 images · non-contrast
Comparison: None.

Addendum:
:
Cardiovascular Disease Risk stratification

EXAM:
Coronary Calcium Score
TECHNIQUE: A gated, non-contrast computed tomography scan of the heart was
performed using 3mm slice thickness. Axial images were analyzed on a
dedicated workstation. Calcium scoring of the coronary arteries was
performed using the Agatston method.

[Series 2: cascseq 3.0 b35f (id) · axial · 0.39mm/px · z∈[-245,-128]mm · 8 of 51 slices shown, 10 images]
[im 6/51  vessel]
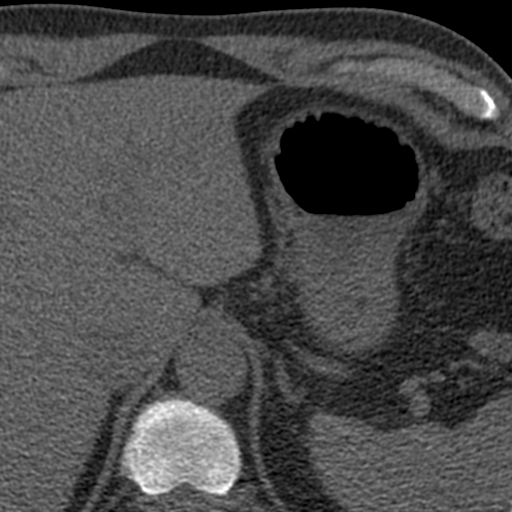
[im 6/51  lung]
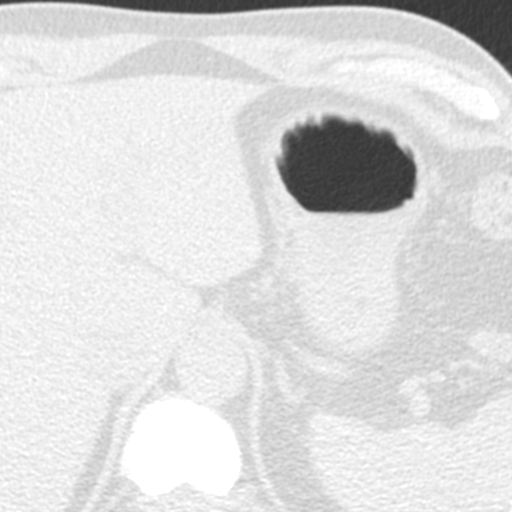
[im 12/51  vessel]
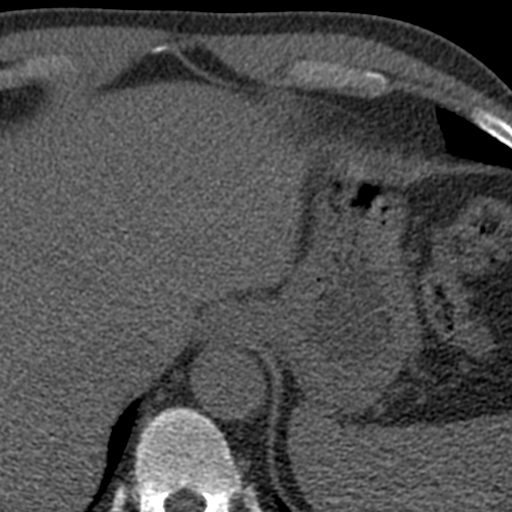
[im 17/51  vessel]
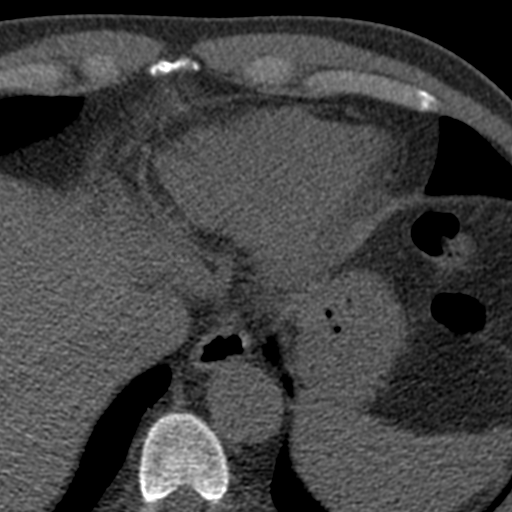
[im 23/51  vessel]
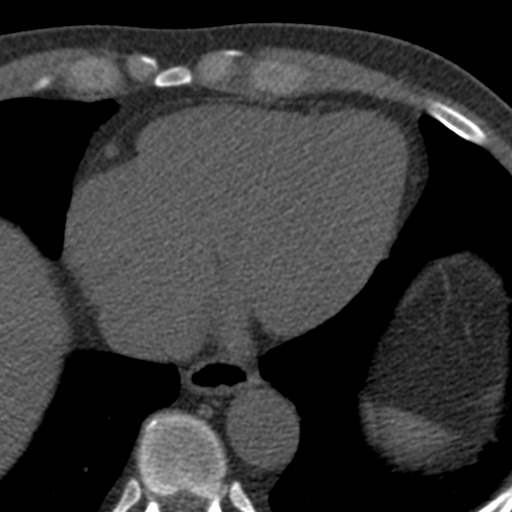
[im 28/51  vessel]
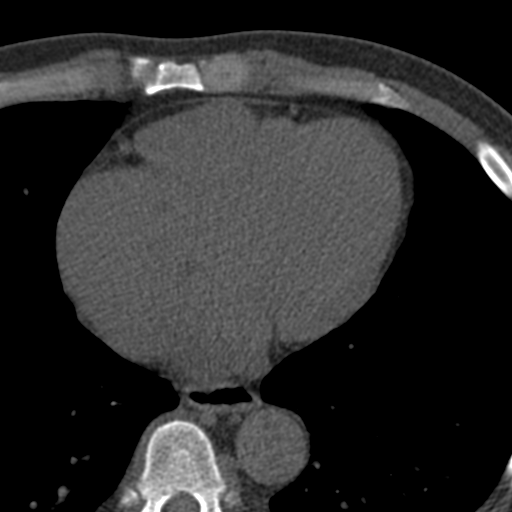
[im 28/51  lung]
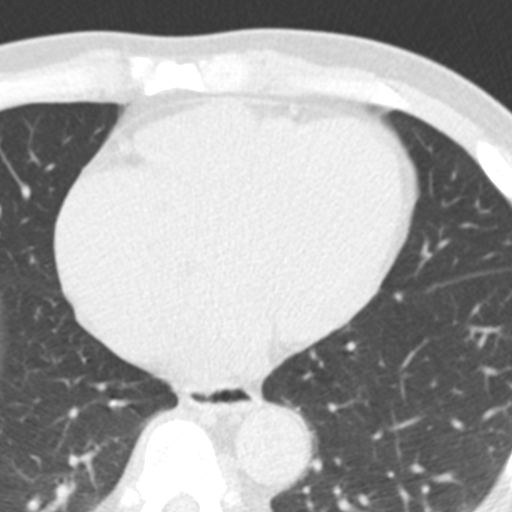
[im 34/51  vessel]
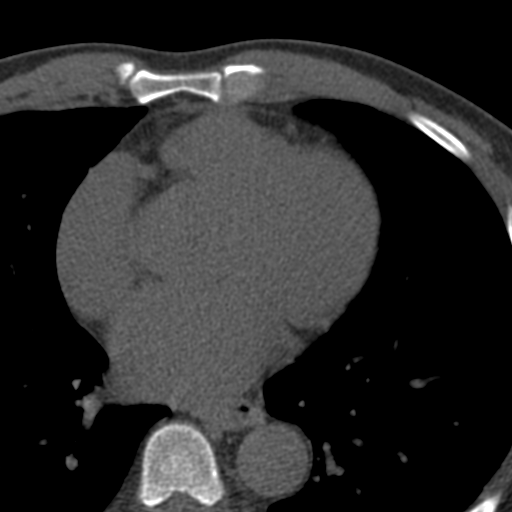
[im 39/51  vessel]
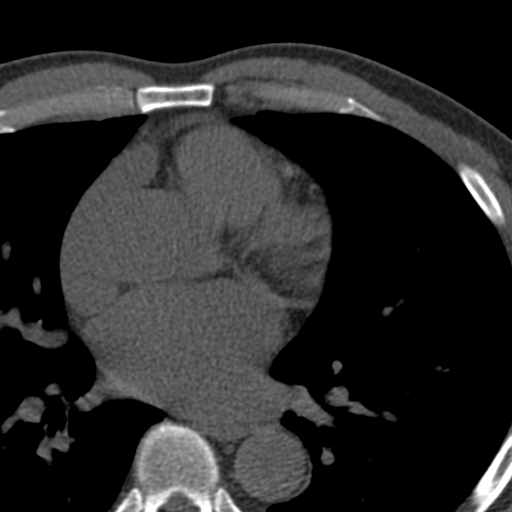
[im 45/51  vessel]
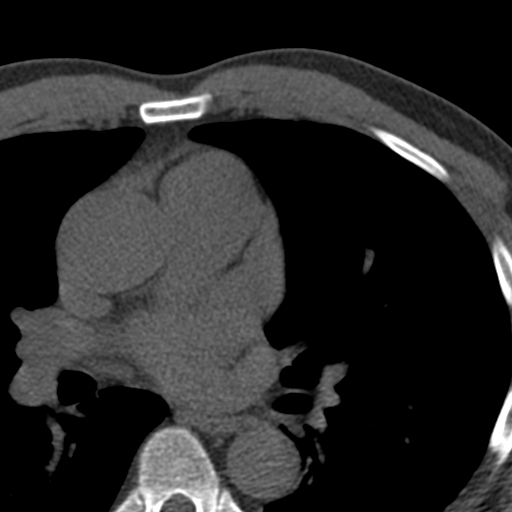

[Series 3: full fov st · axial · 0.74mm/px · z∈[-245,-161]mm · 6 of 51 slices shown]
[im 6/51  vessel]
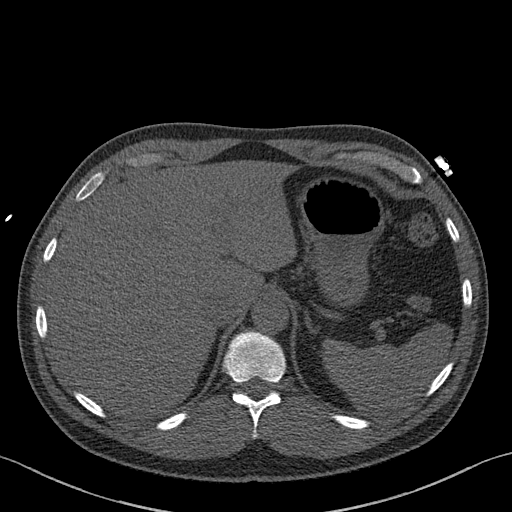
[im 12/51  vessel]
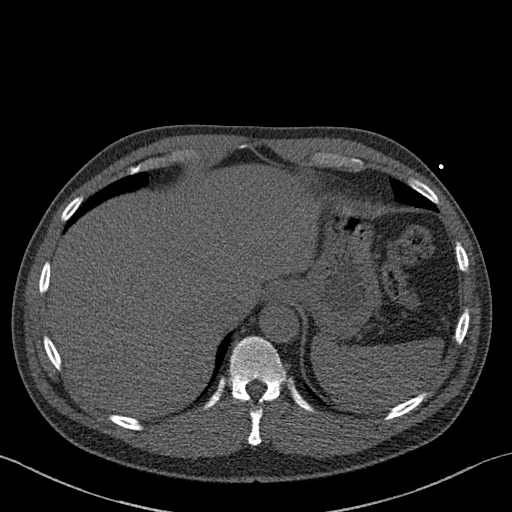
[im 17/51  vessel]
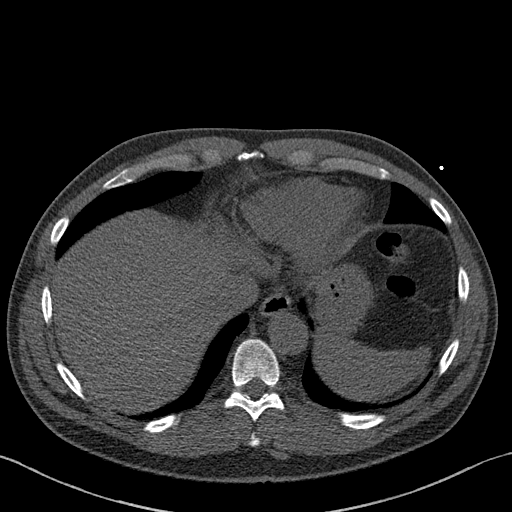
[im 23/51  vessel]
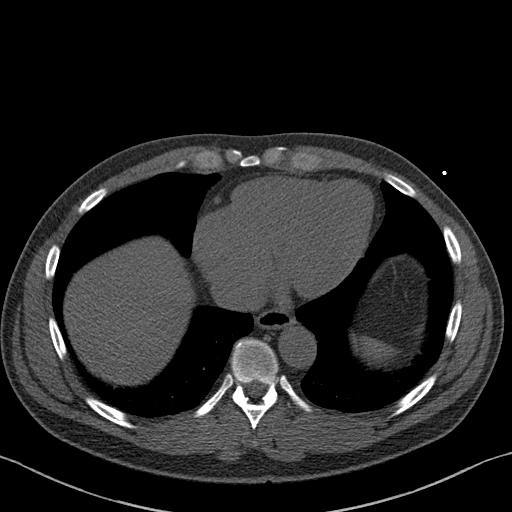
[im 28/51  vessel]
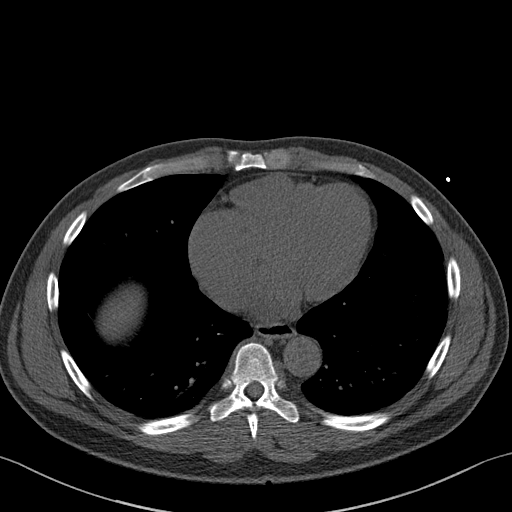
[im 34/51  vessel]
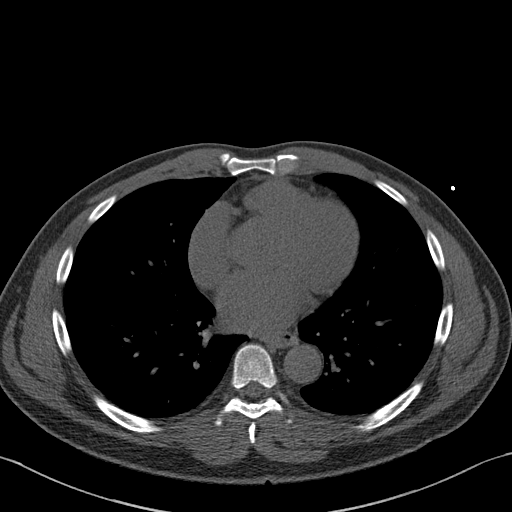

[14 of 20 positions shown; findings below may reference images not displayed]

FINDINGS: Coronary arteries: Normal origins.

Coronary Calcium Score:

Left main: 0

Left anterior descending artery: 0

Left circumflex artery:

Right coronary artery: 0

Total:

Percentile: 58

Pericardium: Normal.

Ascending Aorta: Normal caliber.

Non-cardiac: See separate report from [REDACTED].
IMPRESSION: Coronary calcium score of 7.93. This was 58 percentile for age-,
race-, and sex-matched controls.



If CAC=0, it is reasonable to withhold statin therapy and reassess
in 5 to 10 years, as long as higher risk conditions are absent
(diabetes mellitus, family history of premature CHD in first degree
relatives (males <55 years; females <65 years), cigarette smoking,
or LDL >=190 mg/dL).

If CAC is 1 to 99, it is reasonable to initiate statin therapy for
patients >=55 years of age.

If CAC is >=100 or >=75th percentile, it is reasonable to initiate
statin therapy at any age.

Cardiology referral should be considered for patients with CAC
scores >=400 or >=75th percentile.

*7072 AHA/ACC/AACVPR/AAPA/ABC/GLOD/BUTTAR/MAWADDAH/Jarvin/JHONXITO/MILE/DEME
Guideline on the Management of Blood Cholesterol: A Report of the
American College of Cardiology/American Heart Association Task Force
on Clinical Practice Guidelines. J Am Coll Cardiol.
0736;73(24):9589-9725.

Esben Haarder Katballe, DO [REDACTED]

The noncardiac portion of this study will be interpreted in separate
report by the radiologist.

EXAM:
OVER-READ INTERPRETATION  PET-CT CHEST

The following report is an over-read performed by radiologist Dr.
Piitu Bogel [REDACTED] on 05/12/2021. This over-read
does not include interpretation of cardiac or coronary anatomy or
pathology. The cardiac CT interpretation by the cardiologist is to
be attached.
FINDINGS: The visualized lung parenchyma shows no suspicious pulmonary nodule
or mass. No focal airspace consolidation. No effusion.

No evidence for lymphadenopathy within the visualized mediastinum or
hilar regions.

No suspicious lytic or sclerotic osseous abnormality.
IMPRESSION: No acute or clinically significant extracardiac findings.

*** End of Addendum ***
:
Cardiovascular Disease Risk stratification

EXAM:
Coronary Calcium Score
FINDINGS: Coronary arteries: Normal origins.

Coronary Calcium Score:

Left main: 0

Left anterior descending artery: 0

Left circumflex artery:

Right coronary artery: 0

Total:

Percentile: 58

Pericardium: Normal.

Ascending Aorta: Normal caliber.

Non-cardiac: See separate report from [REDACTED].
IMPRESSION: Coronary calcium score of 7.93. This was 58 percentile for age-,
race-, and sex-matched controls.



If CAC=0, it is reasonable to withhold statin therapy and reassess
in 5 to 10 years, as long as higher risk conditions are absent
(diabetes mellitus, family history of premature CHD in first degree
relatives (males <55 years; females <65 years), cigarette smoking,
or LDL >=190 mg/dL).

If CAC is 1 to 99, it is reasonable to initiate statin therapy for
patients >=55 years of age.

If CAC is >=100 or >=75th percentile, it is reasonable to initiate
statin therapy at any age.

Cardiology referral should be considered for patients with CAC
scores >=400 or >=75th percentile.

*7072 AHA/ACC/AACVPR/AAPA/ABC/GLOD/BUTTAR/MAWADDAH/Jarvin/JHONXITO/MILE/DEME
Guideline on the Management of Blood Cholesterol: A Report of the
American College of Cardiology/American Heart Association Task Force
on Clinical Practice Guidelines. J Am Coll Cardiol.
0736;73(24):9589-9725.

Esben Haarder Katballe, DO [REDACTED]

The noncardiac portion of this study will be interpreted in separate
report by the radiologist.

## 2023-10-20 DIAGNOSIS — E785 Hyperlipidemia, unspecified: Secondary | ICD-10-CM | POA: Diagnosis not present

## 2023-10-28 DIAGNOSIS — K2270 Barrett's esophagus without dysplasia: Secondary | ICD-10-CM | POA: Diagnosis not present

## 2023-10-28 DIAGNOSIS — K824 Cholesterolosis of gallbladder: Secondary | ICD-10-CM | POA: Diagnosis not present

## 2023-10-28 DIAGNOSIS — R1084 Generalized abdominal pain: Secondary | ICD-10-CM | POA: Diagnosis not present

## 2023-11-16 DIAGNOSIS — R1084 Generalized abdominal pain: Secondary | ICD-10-CM | POA: Diagnosis not present

## 2023-11-26 DIAGNOSIS — K2270 Barrett's esophagus without dysplasia: Secondary | ICD-10-CM | POA: Diagnosis not present

## 2023-11-26 DIAGNOSIS — Z1211 Encounter for screening for malignant neoplasm of colon: Secondary | ICD-10-CM | POA: Diagnosis not present

## 2024-02-23 ENCOUNTER — Ambulatory Visit: Admitting: Podiatry

## 2024-02-23 DIAGNOSIS — Q666 Other congenital valgus deformities of feet: Secondary | ICD-10-CM

## 2024-02-23 DIAGNOSIS — M792 Neuralgia and neuritis, unspecified: Secondary | ICD-10-CM

## 2024-02-23 NOTE — Progress Notes (Signed)
 "  Subjective:  Patient ID: Marcus Santiago, male    DOB: 1965/10/20,  MRN: [id number]  Chief Complaint  Patient presents with   Toe Pain    Left great toe pain pt stated that the pain comes and goes but its like a burning numbness pain that he feels he denies any injuries at this time     59 y.o. male presents with the above complaint.  Burning tingling sensation.  He states he notices when he is walking on his foot but mostly when he is resting.  He wanted to get it evaluated he states that started happening over the last couple of weeks has progressed gotten worse.  He wears dress shoes for work.  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   Review of Systems: Negative except as noted in the HPI. Denies N/V/F/Ch.  Past Medical History:  Diagnosis Date   Allergy    Chest pain    (-) stress test 2008, 2010   Headache    Hyperlipemia    Hypertension    Current Medications[1]  Tobacco Use History[2]  Allergies[3] Objective:  There were no vitals filed for this visit. There is no height or weight on file to calculate BMI. Constitutional Well developed. Well nourished.  Vascular Dorsalis pedis pulses palpable bilaterally. Posterior tibial pulses palpable bilaterally. Capillary refill normal to all digits.  No cyanosis or clubbing noted. Pedal hair growth normal.  Neurologic Normal speech. Oriented to person, place, and time. Epicritic sensation to light touch grossly present bilaterally.  Negative Tinel sign noted.  Unable to recreate the signs and symptoms of neuritis  Dermatologic Left hallux no ingrown is noted.  No open wounds or lesion noted.  Orthopedic: Normal joint ROM without pain or crepitus bilaterally. No visible deformities. No bony tenderness.   Radiographs: None Assessment:   1. Neuritis   2. Pes planovalgus    Plan:  Patient was evaluated and treated and all  questions answered.  Left hallux neuritis likely due to tight shoe gear - All questions and concerns were discussed with the patient extensive detail extensively discussed shoe gear modification as well as orthotics management to help support his flatfoot deformity and take some of the pressure away from the big toe.  I discussed this with the patient extensive detail he states understanding like to proceed with orthotics   Pes planovalgus/foot deformity -I explained to patient the etiology of pes planovalgus and relationship with heel pain/arch pain and various treatment options were discussed.  Given patient foot structure in the setting of heel pain/arch pain I believe patient will benefit from custom-made orthotics to help control the hindfoot motion support the arch of the foot and take the stress away from arches.  Patient agrees with the plan like to proceed with orthotics -Patient was casted for orthotics     [1]  Current Outpatient Medications:    Coenzyme Q10 100 MG capsule, Take 1 capsule every day by oral route., Disp: , Rfl:    FLUCELVAX 0.5 ML injection, , Disp: , Rfl:    Sodium Sulfate-Mag Sulfate-KCl (SUTAB) 1479-225-188 MG TABS, TAKE 24 TABLETS BY MOUTH PER PREP SHEET, Disp: , Rfl:    SPIKEVAX syringe, , Disp: , Rfl:    triamcinolone acetonide (KENALOG-40) 40 MG/ML injection, Inject 60 mg IM, Disp: , Rfl:    albuterol  (VENTOLIN  HFA) 108 (90 Base) MCG/ACT inhaler, Inhale  1-2 puffs into the lungs every 6 (six) hours as needed for wheezing or shortness of breath. (Patient not taking: Reported on 08/04/2022), Disp: 8 g, Rfl: 0   aspirin 81 MG tablet, Take 81 mg by mouth daily., Disp: , Rfl:    esomeprazole (NEXIUM) 40 MG capsule, Take 40 mg by mouth daily., Disp: , Rfl:    fluticasone  (FLONASE ) 50 MCG/ACT nasal spray, Place 2 sprays into both nostrils daily., Disp: 16 g, Rfl: 0   icosapent Ethyl (VASCEPA) 1 g capsule, Take 2 g by mouth 2 (two) times daily., Disp: , Rfl:     lisinopril  (ZESTRIL ) 20 MG tablet, Take 1 tablet (20 mg total) by mouth daily., Disp: 90 tablet, Rfl: 0   losartan-hydrochlorothiazide (HYZAAR) 100-12.5 MG tablet, Take 1 tablet by mouth daily., Disp: , Rfl:    losartan-hydrochlorothiazide (HYZAAR) 50-12.5 MG tablet, Take 1 tablet by mouth daily., Disp: , Rfl:    pantoprazole  (PROTONIX ) 40 MG tablet, Take 1 tablet (40 mg total) by mouth daily before breakfast., Disp: 30 tablet, Rfl: 1   rosuvastatin (CRESTOR) 20 MG tablet, Take 20 mg by mouth daily., Disp: , Rfl:  [2]  Social History Tobacco Use  Smoking Status Never  Smokeless Tobacco Never  [3] No Known Allergies  "
